# Patient Record
Sex: Male | Born: 1950 | Race: White | Hispanic: No | Marital: Married | Smoking: Never smoker
Health system: Southern US, Community
[De-identification: ages and names within clinical notes are randomized; demographics above are authoritative.]

## PROBLEM LIST (undated history)

## (undated) DIAGNOSIS — I10 Essential (primary) hypertension: Secondary | ICD-10-CM

## (undated) DIAGNOSIS — J8410 Pulmonary fibrosis, unspecified: Secondary | ICD-10-CM

## (undated) DIAGNOSIS — E785 Hyperlipidemia, unspecified: Secondary | ICD-10-CM

---

## 2005-02-02 ENCOUNTER — Ambulatory Visit: Payer: Self-pay | Admitting: Family Medicine

## 2007-04-23 ENCOUNTER — Ambulatory Visit: Payer: Self-pay

## 2007-11-22 ENCOUNTER — Ambulatory Visit: Payer: Self-pay | Admitting: Gastroenterology

## 2013-05-23 ENCOUNTER — Ambulatory Visit: Payer: Self-pay | Admitting: Specialist

## 2013-08-21 ENCOUNTER — Ambulatory Visit: Payer: Self-pay | Admitting: Specialist

## 2013-08-21 LAB — CREATININE, SERUM
CREATININE: 1.06 mg/dL (ref 0.60–1.30)
EGFR (African American): >60
EGFR (Non-African Amer.): >60

## 2013-08-22 DIAGNOSIS — J449 Chronic obstructive pulmonary disease, unspecified: Secondary | ICD-10-CM | POA: Insufficient documentation

## 2013-08-22 DIAGNOSIS — E669 Obesity, unspecified: Secondary | ICD-10-CM | POA: Insufficient documentation

## 2013-08-22 DIAGNOSIS — G4734 Idiopathic sleep related nonobstructive alveolar hypoventilation: Secondary | ICD-10-CM | POA: Insufficient documentation

## 2013-09-19 DIAGNOSIS — E785 Hyperlipidemia, unspecified: Secondary | ICD-10-CM | POA: Insufficient documentation

## 2014-01-04 ENCOUNTER — Ambulatory Visit: Payer: Self-pay

## 2014-01-04 LAB — URINALYSIS, COMPLETE: Squamous Epithelial: NONE SEEN

## 2014-01-11 ENCOUNTER — Ambulatory Visit: Payer: Self-pay

## 2014-02-06 DIAGNOSIS — Z87898 Personal history of other specified conditions: Secondary | ICD-10-CM | POA: Insufficient documentation

## 2014-12-15 DIAGNOSIS — I10 Essential (primary) hypertension: Secondary | ICD-10-CM | POA: Insufficient documentation

## 2015-06-18 DIAGNOSIS — I6522 Occlusion and stenosis of left carotid artery: Secondary | ICD-10-CM | POA: Insufficient documentation

## 2017-07-04 ENCOUNTER — Encounter: Payer: Self-pay | Admitting: Emergency Medicine

## 2017-07-04 ENCOUNTER — Other Ambulatory Visit: Payer: Self-pay

## 2017-07-04 ENCOUNTER — Emergency Department: Payer: 59

## 2017-07-04 ENCOUNTER — Emergency Department
Admission: EM | Admit: 2017-07-04 | Discharge: 2017-07-04 | Disposition: A | Discharge status: Home / Self Care | Payer: 59 | Attending: Emergency Medicine | Admitting: Emergency Medicine

## 2017-07-04 DIAGNOSIS — R509 Fever, unspecified: Secondary | ICD-10-CM | POA: Diagnosis present

## 2017-07-04 DIAGNOSIS — J181 Lobar pneumonia, unspecified organism: Secondary | ICD-10-CM | POA: Insufficient documentation

## 2017-07-04 DIAGNOSIS — I10 Essential (primary) hypertension: Secondary | ICD-10-CM | POA: Insufficient documentation

## 2017-07-04 DIAGNOSIS — J189 Pneumonia, unspecified organism: Secondary | ICD-10-CM

## 2017-07-04 HISTORY — DX: Hyperlipidemia, unspecified: E78.5

## 2017-07-04 HISTORY — DX: Pulmonary fibrosis, unspecified: J84.10

## 2017-07-04 HISTORY — DX: Essential (primary) hypertension: I10

## 2017-07-04 LAB — CBC
HEMATOCRIT: 41.1 % (ref 40.0–52.0)
Hemoglobin: 14.4 g/dL (ref 13.0–18.0)
MCH: 31.7 pg (ref 26.0–34.0)
MCHC: 35 g/dL (ref 32.0–36.0)
MCV: 90.6 fL (ref 80.0–100.0)
PLATELETS: 267 10*3/uL (ref 150–440)
RBC: 4.54 MIL/uL (ref 4.40–5.90)
RDW: 12.7 % (ref 11.5–14.5)
WBC: 9.2 10*3/uL (ref 3.8–10.6)

## 2017-07-04 LAB — BASIC METABOLIC PANEL
Anion gap: 10 (ref 5–15)
BUN: 11 mg/dL (ref 6–20)
CALCIUM: 8.9 mg/dL (ref 8.9–10.3)
CO2: 23 mmol/L (ref 22–32)
CREATININE: 0.92 mg/dL (ref 0.61–1.24)
Chloride: 101 mmol/L (ref 101–111)
GFR calc Af Amer: >60 mL/min (ref >60)
GLUCOSE: 118 mg/dL — AB (ref 65–99)
Potassium: 3.8 mmol/L (ref 3.5–5.1)
SODIUM: 134 mmol/L — AB (ref 135–145)

## 2017-07-04 LAB — TROPONIN I

## 2017-07-04 MED ORDER — SODIUM CHLORIDE 0.9 % IV SOLN
1.0000 g | Freq: Once | INTRAVENOUS | Status: AC
  Administered 2017-07-04: 1 g via INTRAVENOUS
  Filled 2017-07-04: qty 10

## 2017-07-04 MED ORDER — ACETAMINOPHEN 325 MG PO TABS
650.0000 mg | ORAL_TABLET | Freq: Once | ORAL | Status: AC | PRN
  Administered 2017-07-04: 650 mg via ORAL

## 2017-07-04 MED ORDER — AMOXICILLIN-POT CLAVULANATE ER 1000-62.5 MG PO TB12: 2.0000 | ORAL_TABLET | Freq: Two times a day (BID) | ORAL | 0 refills | Status: DC

## 2017-07-04 MED ORDER — ACETAMINOPHEN 325 MG PO TABS
ORAL_TABLET | ORAL | Status: AC
  Administered 2017-07-04: 650 mg via ORAL
  Filled 2017-07-04: qty 2

## 2017-07-04 MED ORDER — AZITHROMYCIN 250 MG PO TABS: ORAL_TABLET | ORAL | 0 refills | Status: DC

## 2017-07-04 MED ORDER — SODIUM CHLORIDE 0.9 % IV SOLN
500.0000 mg | Freq: Once | INTRAVENOUS | Status: AC
  Administered 2017-07-04: 500 mg via INTRAVENOUS
  Filled 2017-07-04: qty 500

## 2017-07-04 NOTE — ED Provider Notes (Signed)
[facility] Emergency Department Provider Note ____________________________________________   I have reviewed the triage vital signs and the triage nursing note.  HISTORY  Chief Complaint Shortness of Breath and Fever   Historian Patient and wife  HPI Brian Orr is a 67 y.o. male with a history of pulmonary fibrosis, wears oxygen as needed at night, also hypertension hyperlipidemia, states that he was sick last weekend with the flu with fevers, did not take Tamiflu, had low-grade temperatures throughout the week and did not have a fever yesterday and then developed fever again up to 102 and cough which is productive of green sputum yesterday.  Some mild chest pain with cough on the right side.  The sputum also had not only green but some small flecks of blood in it.  Mild shortness of breath like he has had, without wheezing.  Symptoms are moderate.   Past Medical History:  Diagnosis Date  . Hyperlipidemia   . Hypertension   . Pulmonary fibrosis (HCC)     There are no active problems to display for this patient.   History reviewed. No pertinent surgical history.  Prior to Admission medications   Medication Sig Start Date End Date Taking? Authorizing Provider  amoxicillin-clavulanate (AUGMENTIN XR) 1000-62.5 MG 12 hr tablet Take 2 tablets by mouth 2 (two) times daily. 07/04/17   Governor Rooks, MD  azithromycin (ZITHROMAX) 250 MG tablet 1 tablet daily for 4 more days 07/04/17   Governor Rooks, MD    Allergies  Allergen Reactions  . Wasp Venom Anaphylaxis  . Other     Other reaction(s): Unknown POISON IVY    No family history on file.  Social History Social History   Tobacco Use  . Smoking status: Never Smoker  Substance Use Topics  . Alcohol use: Not Currently  . Drug use: Not on file    Review of Systems  Constitutional: Positive for fever. Eyes: Negative for visual changes. ENT: Negative for sore throat. Cardiovascular: Positive  right sided cough induced chest pain as per HPI.   Respiratory: Mild shortness of breath. Gastrointestinal: Negative for abdominal pain, vomiting and diarrhea. Genitourinary: Negative for dysuria. Musculoskeletal: Negative for back pain. Skin: Negative for rash. Neurological: Negative for headache.  ____________________________________________   PHYSICAL EXAM:  VITAL SIGNS: ED Triage Vitals  Enc Vitals Group     BP 07/04/17 0149 130/65     Pulse Rate 07/04/17 0149 (!) 106     Resp 07/04/17 0149 20     Temp 07/04/17 0149 100.1 F (37.8 C)     Temp Source 07/04/17 0149 Oral     SpO2 07/04/17 0149 94 %     Weight 07/04/17 0150 220 lb (99.8 kg)     Height 07/04/17 0150 5\' 8"  (1.727 m)     Head Circumference --      Peak Flow --      Pain Score 07/04/17 0149 9     Pain Loc --      Pain Edu? --      Excl. in GC? --      Constitutional: Alert and oriented. Well appearing and in no distress. HEENT   Head: Normocephalic and atraumatic.      Eyes: Conjunctivae are normal. Pupils equal and round.       Ears:         Nose: No congestion/rhinnorhea.   Mouth/Throat: Mucous membranes are moist.   Neck: No stridor. Cardiovascular/Chest: Normal rate, regular rhythm.  No murmurs, rubs, or gallops.  Respiratory: Normal respiratory effort without tachypnea nor retractions.  Decreased breath sounds right posteriorly. Gastrointestinal: Soft. No distention, no guarding, no rebound. Nontender.    Genitourinary/rectal:Deferred Musculoskeletal: Nontender with normal range of motion in all extremities. No joint effusions.  No lower extremity tenderness.  No edema. Neurologic:  Normal speech and language. No gross or focal neurologic deficits are appreciated. Skin:  Skin is warm, dry and intact. No rash noted. Psychiatric: Mood and affect are normal. Speech and behavior are normal. Patient exhibits appropriate insight and  judgment.   ____________________________________________  LABS (pertinent positives/negatives) I, Governor Rooks, MD the attending physician have reviewed the labs noted below.  Labs Reviewed  BASIC METABOLIC PANEL - Abnormal; Notable for the following components:      Result Value   Sodium 134 (*)    Glucose, Bld 118 (*)    All other components within normal limits  CULTURE, BLOOD (ROUTINE X 2)  CULTURE, BLOOD (ROUTINE X 2)  CBC  TROPONIN I    ____________________________________________    EKG I, Governor Rooks, MD, the attending physician have personally viewed and interpreted all ECGs.  107 bpm.  Sinus tachycardia.  Narrow transfer normal axis.  Nonspecific ST and T wave ____________________________________________  RADIOLOGY   Chest x-ray 2 view radiologist interpretation:Superimposed on fine subpleural areas of fibrosis are faint airspace opacities in the right middle and both lower lobes suspicious for superimposed pneumonia. Cardiomediastinal contours are within normal limits with aortic atherosclerosis. No effusion or pneumothorax.  IMPRESSION: Subtle airspace opacities consistent with pneumonia in the right middle and both lower lobes superimposed on interstitial pulmonary fibrosis. __________________________________________  PROCEDURES  Procedure(s) performed: None  Procedures  Critical Care performed: None   ____________________________________________  ED COURSE / ASSESSMENT AND PLAN  Pertinent labs & imaging results that were available during my care of the patient were reviewed by me and considered in my medical decision making (see chart for details).   Patient was sick last weekend with the flu, now is reporting new fever and productive green sputum with a little bit of blood and on vitals here he did have a fever with a slight tachycardia which is now resolved in terms of the tachycardia.  His x-ray is consistent with developed right sided  pneumonia in the middle and lower lobes.  He is overall pretty well appearing and would like to go home.  I do not think that he is showing signs of sepsis.  He has no elevated white blood cell count, no hypotension.  His O2 sat is around 93% at rest.  He does have oxygen available at home.  I did order blood cultures prior to antibiotics and am giving him his first dose by IV Rocephin and azithromycin and will discharge with Augmentin 2 g p.o. twice daily for 10 days and completion of the azithromycin course, 250 mg once daily for 4 more days.  Not suspicious of empyema or PE at this point.  Patient did well with walking, down to 91% but okay.  Okay for outpatient management.    CONSULTATIONS:   None   Patient / Family / Caregiver informed of clinical course, medical decision-making process, and agree with plan.   I discussed return precautions, follow-up instructions, and discharge instructions with patient and/or family.  Discharge Instructions : You are being treated for pneumonia in the right middle and lower lung.  Return to the emergency department immediately for any worsening condition including trouble breathing, shortness of breath, fever after 24 hours, dizziness  or passing out, any confusion or altered mental status.    ___________________________________________   FINAL CLINICAL IMPRESSION(S) / ED DIAGNOSES   Final diagnoses:  Community acquired pneumonia of right middle lobe of lung (HCC)  Community acquired pneumonia of right lower lobe of lung (HCC)      ___________________________________________        Note: This dictation was prepared with Office managerDragon dictation. Any transcriptional errors that result from this process are unintentional    Governor RooksLord, Eden Toohey, MD 07/04/17 1021

## 2017-07-04 NOTE — Discharge Instructions (Signed)
You are being treated for pneumonia in the right middle and lower lung.  Return to the emergency department immediately for any worsening condition including trouble breathing, shortness of breath, fever after 24 hours, dizziness or passing out, any confusion or altered mental status.

## 2017-07-04 NOTE — ED Notes (Signed)
Patient updated on wait time. Patient verbalizes understanding. Patient in no acute distress at this time.

## 2017-07-04 NOTE — ED Notes (Signed)
Dr. Shaune PollackLord notified of patients oxygen saturation while ambulating, pt ok for discharge once antibiotics are completed.

## 2017-07-04 NOTE — ED Triage Notes (Signed)
Pt presents to ER from home history of pulmonary fibrosis pt's wife reports he had the flu last week and developed a fever tonight of 102. F reports continues with productive cough sputum  yellow in color .

## 2017-07-04 NOTE — ED Notes (Signed)
Pt's wife reports pt wears oxygen at night 2l/[place], administered oxygen to pt

## 2017-07-09 LAB — CULTURE, BLOOD (ROUTINE X 2)
Culture: NO GROWTH
Culture: NO GROWTH
SPECIAL REQUESTS: ADEQUATE

## 2019-02-19 DIAGNOSIS — Z8616 Personal history of COVID-19: Secondary | ICD-10-CM | POA: Insufficient documentation

## 2020-02-22 DIAGNOSIS — Z6710 Type A blood, Rh positive: Secondary | ICD-10-CM | POA: Insufficient documentation

## 2020-02-25 ENCOUNTER — Emergency Department: Payer: Medicare HMO

## 2020-02-25 ENCOUNTER — Encounter: Payer: Self-pay | Admitting: Emergency Medicine

## 2020-02-25 ENCOUNTER — Inpatient Hospital Stay
Admission: EM | Admit: 2020-02-25 | Discharge: 2020-02-27 | DRG: 087 | Disposition: A | Discharge status: Home / Self Care | Payer: Medicare HMO | Attending: Internal Medicine | Admitting: Internal Medicine

## 2020-02-25 ENCOUNTER — Observation Stay: Payer: Medicare HMO

## 2020-02-25 ENCOUNTER — Other Ambulatory Visit: Payer: Self-pay

## 2020-02-25 DIAGNOSIS — S066X0A Traumatic subarachnoid hemorrhage without loss of consciousness, initial encounter: Secondary | ICD-10-CM | POA: Diagnosis present

## 2020-02-25 DIAGNOSIS — Z8679 Personal history of other diseases of the circulatory system: Secondary | ICD-10-CM | POA: Insufficient documentation

## 2020-02-25 DIAGNOSIS — Z6831 Body mass index (BMI) 31.0-31.9, adult: Secondary | ICD-10-CM

## 2020-02-25 DIAGNOSIS — S065X0A Traumatic subdural hemorrhage without loss of consciousness, initial encounter: Principal | ICD-10-CM | POA: Diagnosis present

## 2020-02-25 DIAGNOSIS — R9431 Abnormal electrocardiogram [ECG] [EKG]: Secondary | ICD-10-CM

## 2020-02-25 DIAGNOSIS — Z91048 Other nonmedicinal substance allergy status: Secondary | ICD-10-CM

## 2020-02-25 DIAGNOSIS — K1120 Sialoadenitis, unspecified: Secondary | ICD-10-CM | POA: Diagnosis present

## 2020-02-25 DIAGNOSIS — Y92009 Unspecified place in unspecified non-institutional (private) residence as the place of occurrence of the external cause: Secondary | ICD-10-CM | POA: Diagnosis not present

## 2020-02-25 DIAGNOSIS — Z91030 Bee allergy status: Secondary | ICD-10-CM

## 2020-02-25 DIAGNOSIS — E669 Obesity, unspecified: Secondary | ICD-10-CM | POA: Diagnosis present

## 2020-02-25 DIAGNOSIS — I609 Nontraumatic subarachnoid hemorrhage, unspecified: Secondary | ICD-10-CM

## 2020-02-25 DIAGNOSIS — E785 Hyperlipidemia, unspecified: Secondary | ICD-10-CM | POA: Diagnosis present

## 2020-02-25 DIAGNOSIS — S065XAA Traumatic subdural hemorrhage with loss of consciousness status unknown, initial encounter: Secondary | ICD-10-CM | POA: Diagnosis present

## 2020-02-25 DIAGNOSIS — I951 Orthostatic hypotension: Secondary | ICD-10-CM | POA: Diagnosis present

## 2020-02-25 DIAGNOSIS — Y92019 Unspecified place in single-family (private) house as the place of occurrence of the external cause: Secondary | ICD-10-CM

## 2020-02-25 DIAGNOSIS — I6523 Occlusion and stenosis of bilateral carotid arteries: Secondary | ICD-10-CM | POA: Diagnosis present

## 2020-02-25 DIAGNOSIS — S065X9A Traumatic subdural hemorrhage with loss of consciousness of unspecified duration, initial encounter: Secondary | ICD-10-CM | POA: Diagnosis present

## 2020-02-25 DIAGNOSIS — R55 Syncope and collapse: Principal | ICD-10-CM

## 2020-02-25 DIAGNOSIS — I10 Essential (primary) hypertension: Secondary | ICD-10-CM | POA: Diagnosis present

## 2020-02-25 DIAGNOSIS — W19XXXA Unspecified fall, initial encounter: Secondary | ICD-10-CM | POA: Diagnosis not present

## 2020-02-25 DIAGNOSIS — L0390 Cellulitis, unspecified: Secondary | ICD-10-CM | POA: Diagnosis present

## 2020-02-25 DIAGNOSIS — Z20822 Contact with and (suspected) exposure to covid-19: Secondary | ICD-10-CM | POA: Diagnosis present

## 2020-02-25 DIAGNOSIS — W1839XA Other fall on same level, initial encounter: Secondary | ICD-10-CM | POA: Diagnosis present

## 2020-02-25 DIAGNOSIS — J8410 Pulmonary fibrosis, unspecified: Secondary | ICD-10-CM | POA: Diagnosis present

## 2020-02-25 DIAGNOSIS — I6200 Nontraumatic subdural hemorrhage, unspecified: Secondary | ICD-10-CM

## 2020-02-25 LAB — CBC
HCT: 39.9 % (ref 39.0–52.0)
Hemoglobin: 14 g/dL (ref 13.0–17.0)
MCH: 32.3 pg (ref 26.0–34.0)
MCHC: 35.1 g/dL (ref 30.0–36.0)
MCV: 92.1 fL (ref 80.0–100.0)
Platelets: 296 10*3/uL (ref 150–400)
RBC: 4.33 MIL/uL (ref 4.22–5.81)
RDW: 11.6 % (ref 11.5–15.5)
WBC: 9.5 10*3/uL (ref 4.0–10.5)
nRBC: 0 % (ref 0.0–0.2)

## 2020-02-25 LAB — BASIC METABOLIC PANEL
Anion gap: 13 (ref 5–15)
BUN: 14 mg/dL (ref 8–23)
CO2: 27 mmol/L (ref 22–32)
Calcium: 9.3 mg/dL (ref 8.9–10.3)
Chloride: 99 mmol/L (ref 98–111)
Creatinine, Ser: 1.07 mg/dL (ref 0.61–1.24)
GFR, Estimated: >60 mL/min (ref >60)
Glucose, Bld: 121 mg/dL — ABNORMAL HIGH (ref 70–99)
Potassium: 3.4 mmol/L — ABNORMAL LOW (ref 3.5–5.1)
Sodium: 139 mmol/L (ref 135–145)

## 2020-02-25 LAB — RESPIRATORY PANEL BY RT PCR (FLU A&B, COVID)
Influenza A by PCR: NEGATIVE
Influenza B by PCR: NEGATIVE
SARS Coronavirus 2 by RT PCR: NEGATIVE

## 2020-02-25 LAB — TROPONIN I (HIGH SENSITIVITY)
Troponin I (High Sensitivity): 6 ng/L (ref <18)
Troponin I (High Sensitivity): 6 ng/L (ref <18)

## 2020-02-25 MED ORDER — SODIUM CHLORIDE 0.9 % IV SOLN
1.5000 g | Freq: Three times a day (TID) | INTRAVENOUS | Status: DC
  Administered 2020-02-26: 08:00:00 1.5 g via INTRAVENOUS
  Filled 2020-02-25: qty 1.5
  Filled 2020-02-25 (×2): qty 4

## 2020-02-25 MED ORDER — ALBUTEROL SULFATE HFA 108 (90 BASE) MCG/ACT IN AERS
1.0000 | INHALATION_SPRAY | RESPIRATORY_TRACT | Status: DC | PRN
  Filled 2020-02-25: qty 6.7

## 2020-02-25 MED ORDER — IPRATROPIUM-ALBUTEROL 0.5-2.5 (3) MG/3ML IN SOLN: 3.0000 mL | Freq: Four times a day (QID) | RESPIRATORY_TRACT | Status: DC | PRN

## 2020-02-25 MED ORDER — SODIUM CHLORIDE 0.9 % IV SOLN
3.0000 g | Freq: Once | INTRAVENOUS | Status: AC
  Administered 2020-02-25: 3 g via INTRAVENOUS
  Filled 2020-02-25: qty 8

## 2020-02-25 MED ORDER — THIAMINE HCL 100 MG/ML IJ SOLN
Freq: Once | INTRAVENOUS | Status: AC
  Filled 2020-02-25: qty 1000

## 2020-02-25 MED ORDER — IOHEXOL 300 MG/ML  SOLN
75.0000 mL | Freq: Once | INTRAMUSCULAR | Status: AC | PRN
  Administered 2020-02-25: 75 mL via INTRAVENOUS

## 2020-02-25 MED ORDER — CALCIUM CARBONATE ANTACID 500 MG PO CHEW
2.0000 | CHEWABLE_TABLET | Freq: Once | ORAL | Status: AC
  Administered 2020-02-25: 400 mg via ORAL
  Filled 2020-02-25: qty 2

## 2020-02-25 NOTE — Assessment & Plan Note (Signed)
presentation is suspicious for syncope so we will do an echo and carotid and mri or brain.  Orthostatic vitals.  Cardiac enzymes. comprehensive labs and  tft.  Admit obs to med tele.

## 2020-02-25 NOTE — Discharge Instructions (Addendum)
Do not take any aspirin or blood thinners for 1 week from today.  Call Dr. Adriana Simas the neurosurgeon's office to schedule a follow-up appointment in 2 to 3 weeks.

## 2020-02-25 NOTE — Assessment & Plan Note (Signed)
Neuro checks q4 h

## 2020-02-25 NOTE — ED Triage Notes (Signed)
Pt presents to ED via POV, states got "really really dizzy and fell". Pt also c/o cramp to his jaw. Pt states hit his head, denies LOC. Pt A&O x4, ambulatory without difficulty. Pt c/o feeling a knot to L side of his face.

## 2020-02-25 NOTE — ED Notes (Signed)
Echo at bedside with patient and then to MRI after before transport to the floor. Patient and family aware of plan.

## 2020-02-25 NOTE — H&P (Signed)
History and Physical    Brian RongDavid Sedano ZOX:[id number] DOB: 03/14/1951 DOA: 02/25/2020  [place]    Patient coming from:  home   Chief Complaint:  Dizziness and fall   HPI: Brian Orr is a 69 y.o. male with no significant pmh seen in ed for fall and hurting his jaw and head and head ct shows Anterior falcine subdural hematoma measuring 3 mm in thickness. 2. Small areas of focal subarachnoid hemorrhage along the medial aspect of the left frontal lobe. 3. Small right occipital scalp hematoma.  No underlying fracture, also Mild thickening of the platysma and subcutaneous fat stranding of the lower left face, near the tail of the parotid gland, likely indicating cellulitis. Per edmd PT had ekg changes with twi in lead III and ? If this is cardiac related.  Pt has not bp history.  But we will admit of r observation and cycle  cardiac enzymes and echo. Pt state he ate one bite and had left sided facil apian nad got up to get water and he got dizzy and fell.  ED Course:  Vitals:   02/25/20 2000 02/25/20 2015 02/25/20 2030 02/25/20 2045  BP: 132/61 (!) 160/84 (!) 150/72 (!) 143/68  Pulse: 84 99 86 81  Resp: 17 (!) 22 (!) 21 18  Temp:      TempSrc:      SpO2: 95% 95% 92% 98%  Weight:      Height:      In ed pt is alert and oriented and had imaging studies and labs which are stable.     Review of Systems:  Review of Systems  Neurological: Positive for dizziness, loss of consciousness and weakness.  All other systems reviewed and are negative.    Past Medical History:  Diagnosis Date  . Hyperlipidemia   . Hypertension   . Pulmonary fibrosis (HCC)     History reviewed. No pertinent surgical history.   reports that he has never smoked. He does not have any smokeless tobacco history on file. He reports previous alcohol use. No history on file for drug use.  Allergies  Allergen Reactions  . Wasp Venom Anaphylaxis  . Other     Other reaction(s):  Unknown POISON IVY    History reviewed. No pertinent family history.  Prior to Admission medications   Medication Sig Start Date End Date Taking? Authorizing Provider  amoxicillin-clavulanate (AUGMENTIN XR) 1000-62.5 MG 12 hr tablet Take 2 tablets by mouth 2 (two) times daily. 07/04/17   Governor RooksLord, Rebecca, MD  azithromycin [facility](ZITHROMAX) 250 MG tablet 1 tablet daily for 4 more days 07/04/17   Governor RooksLord, Rebecca, MD    Physical Exam: Vitals:   02/25/20 2000 02/25/20 2015 02/25/20 2030 02/25/20 2045  BP: 132/61 (!) 160/84 (!) 150/72 (!) 143/68  Pulse: 84 99 86 81  Resp: 17 (!) 22 (!) 21 18  Temp:      TempSrc:      SpO2: 95% 95% 92% 98%  Weight:      Height:        Physical Exam Vitals and nursing note reviewed.  Constitutional:      Appearance: He is normal weight.  HENT:     Head: Normocephalic and atraumatic.     Right Ear: External ear normal.     Left Ear: External ear normal.     Mouth/Throat:     Mouth: Mucous membranes are moist.  Eyes:     Extraocular Movements: Extraocular movements intact.  Pupils: Pupils are equal, round, and reactive to light.  Cardiovascular:     Rate and Rhythm: Normal rate and regular rhythm.     Pulses: Normal pulses.     Heart sounds: Normal heart sounds.  Pulmonary:     Effort: Pulmonary effort is normal. No respiratory distress.     Breath sounds: Examination of the right-upper field reveals rhonchi. Examination of the left-upper field reveals rhonchi. Examination of the right-middle field reveals rhonchi. Examination of the left-middle field reveals rhonchi. Examination of the right-lower field reveals rhonchi. Examination of the left-lower field reveals rhonchi. Rhonchi present.  Abdominal:     Palpations: Abdomen is soft.  Musculoskeletal:        General: No swelling. Normal range of motion.     Cervical back: Normal range of motion.  Skin:    General: Skin is warm.  Neurological:     General: No focal deficit present.     Mental  Status: He is alert and oriented to person, place, and time.  Psychiatric:        Mood and Affect: Mood normal.        Behavior: Behavior normal.    Labs on Admission: I have personally reviewed following labs and imaging studies  CBC: Recent Labs  Lab 02/25/20 1752  WBC 9.5  HGB 14.0  HCT 39.9  MCV 92.1  PLT 296   Basic Metabolic Panel: Recent Labs  Lab 02/25/20 1752  NA 139  K 3.4*  CL 99  CO2 27  GLUCOSE 121*  BUN 14  CREATININE 1.07  CALCIUM 9.3   GFR: Estimated Creatinine Clearance: 75.9 mL/min (by C-G formula based on SCr of 1.07 mg/dL). Liver Function Tests: No results for input(s): AST, ALT, ALKPHOS, BILITOT, PROT, ALBUMIN in the last 168 hours. No results for input(s): LIPASE, AMYLASE in the last 168 hours. No results for input(s): AMMONIA in the last 168 hours. Coagulation Profile: No results for input(s): INR, PROTIME in the last 168 hours. Cardiac Enzymes: No results for input(s): CKTOTAL, CKMB, CKMBINDEX, TROPONINI in the last 168 hours. BNP (last 3 results) No results for input(s): PROBNP in the last 8760 hours. HbA1C: No results for input(s): HGBA1C in the last 72 hours. CBG: No results for input(s): GLUCAP in the last 168 hours. Lipid Profile: No results for input(s): CHOL, HDL, LDLCALC, TRIG, CHOLHDL, LDLDIRECT in the last 72 hours. Thyroid Function Tests: No results for input(s): TSH, T4TOTAL, FREET4, T3FREE, THYROIDAB in the last 72 hours. Anemia Panel: No results for input(s): VITAMINB12, FOLATE, FERRITIN, TIBC, IRON, RETICCTPCT in the last 72 hours. Urine analysis:    Component Value Date/Time   COLORURINE ORANGE 01/04/2014 1405   APPEARANCEUR HAZY 01/04/2014 1405   LABSPEC SEE COMMENT 01/04/2014 1405   PHURINE SEE COMMENT 01/04/2014 1405   GLUCOSEU SEE COMMENT 01/04/2014 1405   HGBUR SEE COMMENT 01/04/2014 1405   BILIRUBINUR SEE COMMENT 01/04/2014 1405   KETONESUR SEE COMMENT 01/04/2014 1405   PROTEINUR SEE COMMENT 01/04/2014 1405    NITRITE SEE COMMENT 01/04/2014 1405   LEUKOCYTESUR SEE COMMENT 01/04/2014 1405   No intake or output data in the 24 hours ending 02/25/20 2248 Lab Results  Component Value Date   CREATININE 1.07 02/25/2020   CREATININE 0.92 07/04/2017   CREATININE 1.06 08/21/2013    COVID-19 Labs  No results for input(s): DDIMER, FERRITIN, LDH, CRP in the last 72 hours.  Lab Results  Component Value Date   SARSCOV2NAA NEGATIVE 02/25/2020    Radiological Exams on  Admission: DG Chest 2 View  Result Date: 02/25/2020 CLINICAL DATA:  69 year old male with fall. EXAM: CHEST - 2 VIEW COMPARISON:  Chest radiograph dated 07/04/2017 FINDINGS: Emphysema with chronic interstitial coarsening. There is a 2 cm right upper lobe nodule, new since the prior radiograph. Further evaluation with chest CT is recommended. No consolidative changes. There is no pleural effusion pneumothorax. The cardiac silhouette is within limits. No acute osseous pathology. Degenerative changes of the spine. IMPRESSION: 1. No acute cardiopulmonary process.  Emphysema. 2. A 2 cm right upper lobe nodule. Further evaluation with chest CT is recommended. Electronically Signed   By: Elgie Collard M.D.   On: 02/25/2020 19:45   CT Head Wo Contrast Result Date: 02/25/2020 CLINICAL DATA:  Dizzy, fell EXAM: CT HEAD WITHOUT CONTRAST TECHNIQUE: Contiguous axial images were obtained from the base of the skull through the vertex without intravenous contrast. COMPARISON:  None. FINDINGS: Brain: There is a small subdural hematoma along the anterior falx, measuring approximately 3 mm in thickness. Small hyperdense areas are seen within the medial aspect the left frontal lobe, consistent with small foci of subarachnoid hemorrhage. No mass effect. No acute infarct. Lateral ventricles are unremarkable. Vascular: No hyperdense vessel or unexpected calcification. Skull: Small right occipital scalp hematoma. No underlying fracture. The remainder of the  calvarium is unremarkable. Sinuses/Orbits: No acute finding. Other: None.  IMPRESSION:  1. Anterior falcine subdural hematoma measuring 3 mm in thickness.  2. Small areas of focal subarachnoid hemorrhage along the medial aspect of the left frontal lobe.  3. Small right occipital scalp hematoma.  No underlying fracture. Critical Value/emergent results were called by telephone at the time of interpretation on 02/25/2020 at 7:06 pm to provider [facility] , who verbally acknowledged these results. Electronically Signed   By: Sharlet Salina M.D.   On: 02/25/2020 19:06   CT Cervical Spine Wo Contrast Result Date: 02/25/2020 CLINICAL DATA:  69 year old male with head trauma. EXAM: CT CERVICAL SPINE WITHOUT CONTRAST TECHNIQUE: Multidetector CT imaging of the cervical spine was performed without intravenous contrast. Multiplanar CT image reconstructions were also generated. COMPARISON:  None. FINDINGS: Alignment: No acute subluxation. Skull base and vertebrae: No acute fracture. Osteopenia. Soft tissues and spinal canal: No prevertebral fluid or swelling. No visible canal hematoma. Disc levels: Multilevel degenerative changes with endplate irregularity and disc space narrowing. Upper chest: Emphysema. Other: Bilateral carotid bulb calcified plaques.  IMPRESSION:  1. No acute/traumatic cervical spine pathology.  2. Multilevel degenerative changes.  Electronically Signed   By: Elgie Collard M.D.   On: 02/25/2020 18:57   CT Maxillofacial W Contrast Result Date: 02/25/2020 CLINICAL DATA:  Left facial pain EXAM: CT MAXILLOFACIAL WITH CONTRAST TECHNIQUE: Multidetector CT imaging of the maxillofacial structures was performed with intravenous contrast. Multiplanar CT image reconstructions were also generated. CONTRAST:  55mL OMNIPAQUE IOHEXOL 300 MG/ML  SOLN COMPARISON:  None. FINDINGS: Osseous: No fracture or mandibular dislocation. No destructive process. Orbits: Negative. No traumatic or inflammatory finding.  Sinuses: Clear. Soft tissues: There is mild thickening of the platysma and subcutaneous fat stranding of the lower left face, near the tail of the parotid gland. No sialolithiasis. No obvious source of infection. Limited intracranial: Normal  IMPRESSION: Mild thickening of the platysma and subcutaneous fat stranding of the lower left face, near the tail of the parotid gland, likely indicating cellulitis. No sialolithiasis or obvious source of infection. Electronically Signed   By: Deatra Robinson M.D.   On: 02/25/2020 20:56    EKG: Independently reviewed.  Sinus rhythm 95, TWI in lead III.    Assessment/Plan Subdural hematoma (HCC) Assessment & Plan Neuro checks q4 h   Cellulitis Assessment & Plan ? Parotitis we will continue iv abx.,    Fall at home, initial encounter/ Syncope Assessment & Plan presentation is suspicious for syncope so we will do an echo and carotid and mri or brain.  Orthostatic vitals.  Cardiac enzymes. comprehensive labs and  tft.  Admit obs to med tele.   DVT prophylaxis:  SCD's.   Code Status:  Full code.  Family Communication:  None at bedside.   Disposition Plan:  Home.   Consults called:  None.  Admission status: Status is: Observation  The patient remains OBS appropriate and will d/c before 2 midnights.  Dispo: The patient is from: Home              Anticipated d/c is to: Home              Anticipated d/c date is: 2 days              Patient currently is not medically stable to d/c.   Gertha Calkin MD [facility] [phone] How to contact the [facility] Attending or Consulting provider 7A - 7P or covering provider during after hours 7P -7A, for this patient?    1. Check the care team in [facility] and look for a) attending/consulting [facility] provider listed and b) the [facility] team listed 2. Log into [email or web address] and use [place]'s universal password to access. If you do not have the password, please contact the hospital operator. 3. Locate  the [facility] provider you are looking for under [facility] and page to a number that you can be directly reached. 4. If you still have difficulty reaching the provider, please page the [facility] (Director on Call) for the Hospitalists listed on amion for assistance. [email or web address] Password [facility] 02/25/2020, 10:48 PM

## 2020-02-25 NOTE — ED Notes (Signed)
MRI reports to this RN they are ready for patient.

## 2020-02-25 NOTE — ED Notes (Signed)
Attempted to give reportx1 

## 2020-02-25 NOTE — Assessment & Plan Note (Signed)
?   Parotitis we will continue iv abx.,

## 2020-02-25 NOTE — ED Provider Notes (Signed)
[facility] Emergency Department Provider Note   ____________________________________________   First MD Initiated Contact with Patient 02/25/20 1922     (approximate)  I have reviewed the triage vital signs and the nursing notes.   HISTORY  Chief Complaint Dizziness and Fall    HPI Brian Orr is a 69 y.o. male who noticed sudden onset of painful swelling at the angle of the left jaw.  He went to drink some water and then got very lightheaded fell down and hit his head.  He was then very sweaty.  He did not seem to pass out.  His wife was with him the whole time.  He feels much better now.  He still has a swollen painful area at the angle of the left jaw.         Past Medical History:  Diagnosis Date  . Hyperlipidemia   . Hypertension   . Pulmonary fibrosis [facility]HCC)     Patient Active Problem List   Diagnosis Date Noted  . Fall at home, initial encounter 02/25/2020    History reviewed. No pertinent surgical history.  Prior to Admission medications   Medication Sig Start Date End Date Taking? Authorizing Provider  amoxicillin-clavulanate (AUGMENTIN XR) 1000-62.5 MG 12 hr tablet Take 2 tablets by mouth 2 (two) times daily. 07/04/17   Governor RooksLord, Rebecca, MD  azithromycin (ZITHROMAX) 250 MG tablet 1 tablet daily for 4 more days 07/04/17   Governor RooksLord, Rebecca, MD    Allergies Wasp venom and Other  History reviewed. No pertinent family history.  Social History Social History   Tobacco Use  . Smoking status: Never Smoker  Substance Use Topics  . Alcohol use: Not Currently  . Drug use: Not on file    Review of Systems  Constitutional: No fever/chills Eyes: No visual changes. ENT: No sore throat. Cardiovascular: Denies chest pain. Respiratory: Denies shortness of breath. Gastrointestinal: No abdominal pain.  No nausea, no vomiting.  No diarrhea.  No constipation. Genitourinary: Negative for dysuria. Musculoskeletal: Negative for back  pain. Skin: Negative for rash. Neurological: Negative for headaches, focal weakness   ____________________________________________   PHYSICAL EXAM:  VITAL SIGNS: ED Triage Vitals  Enc Vitals Group     BP 02/25/20 1751 123/67     Pulse Rate 02/25/20 1751 98     Resp 02/25/20 1751 20     Temp 02/25/20 1751 98.2 F (36.8 C)     Temp Source 02/25/20 1751 Oral     SpO2 02/25/20 1751 96 %     Weight 02/25/20 1744 208 lb (94.3 kg)     Height 02/25/20 1744 5\' 9"  (1.753 m)     Head Circumference --      Peak Flow --      Pain Score 02/25/20 1743 4     Pain Loc --      Pain Edu? --      Excl. in GC? --     Constitutional: Alert and oriented. Well appearing and in no acute distress. Eyes: Conjunctivae are normal. PERRL. EOMI. Head: Atraumatic.  Patient does have a swelling at the left angle of the jaw as described.  It covers the angle of the jaw Nose: No congestion/rhinnorhea. Mouth/Throat: Mucous membranes are moist.  Oropharynx non-erythematous.  Teeth are not tender Neck: No stridor.  Cardiovascular: Normal rate, regular rhythm. Grossly normal heart sounds.  Good peripheral circulation. Respiratory: Normal respiratory effort.  No retractions. Lungs CTAB. Gastrointestinal: Soft and nontender. No distention. No abdominal bruits.  Musculoskeletal: No lower extremity tenderness nor edema.  Neurologic:  Normal speech and language. No gross focal neurologic deficits are appreciated.  Cranial nerves II through XII are intact although visual fields were not checked cerebellar finger-nose and rapid alternating movements and hands are normal motor strength is 5/5 throughout patient does not report any numbness Skin:  Skin is warm, dry and intact. No rash noted.  ____________________________________________   LABS (all labs ordered are listed, but only abnormal results are displayed)  Labs Reviewed  BASIC METABOLIC PANEL - Abnormal; Notable for the following components:      Result  Value   Potassium 3.4 (*)    Glucose, Bld 121 (*)    All other components within normal limits  RESPIRATORY PANEL BY RT PCR (FLU A&B, COVID)  CBC  TROPONIN I (HIGH SENSITIVITY)  TROPONIN I (HIGH SENSITIVITY)   ____________________________________________  EKG  EKG rhythm interpreted by me shows normal sinus rhythm at 95 flipped T's in 1 and now with left axis there is also flattening of T waves in V45 and 6. ___EKG #2 read interpreted by me shows normal sinus rhythm rate of 80 left axis a ST segment downsloping and somewhat inverted T waves in 1 and L have resolved completely.  There is still flattening of the T waves in V4 5 and 6 although it looks somewhat better than the original one.  _________________________________________  RADIOLOGY I, Arnaldo Natal, personally viewed and evaluated these images (plain radiographs) as part of my medical decision making, as well as reviewing the written report by the radiologist.  ED MD interpretation: CT of the head and neck read by radiology reviewed by me does not show any bony problems.  There is a small anterior falcine subdural hematoma and some small areas of subarachnoid hemorrhage.  Official radiology report(s): DG Chest 2 View  Result Date: 02/25/2020 CLINICAL DATA:  69 year old male with fall. EXAM: CHEST - 2 VIEW COMPARISON:  Chest radiograph dated 07/04/2017 FINDINGS: Emphysema with chronic interstitial coarsening. There is a 2 cm right upper lobe nodule, new since the prior radiograph. Further evaluation with chest CT is recommended. No consolidative changes. There is no pleural effusion pneumothorax. The cardiac silhouette is within limits. No acute osseous pathology. Degenerative changes of the spine. IMPRESSION: 1. No acute cardiopulmonary process.  Emphysema. 2. A 2 cm right upper lobe nodule. Further evaluation with chest CT is recommended. Electronically Signed   By: Elgie Collard M.D.   On: 02/25/2020 19:45   CT Head Wo  Contrast  Result Date: 02/25/2020 CLINICAL DATA:  Dizzy, fell EXAM: CT HEAD WITHOUT CONTRAST TECHNIQUE: Contiguous axial images were obtained from the base of the skull through the vertex without intravenous contrast. COMPARISON:  None. FINDINGS: Brain: There is a small subdural hematoma along the anterior falx, measuring approximately 3 mm in thickness. Small hyperdense areas are seen within the medial aspect the left frontal lobe, consistent with small foci of subarachnoid hemorrhage. No mass effect. No acute infarct. Lateral ventricles are unremarkable. Vascular: No hyperdense vessel or unexpected calcification. Skull: Small right occipital scalp hematoma. No underlying fracture. The remainder of the calvarium is unremarkable. Sinuses/Orbits: No acute finding. Other: None. IMPRESSION: 1. Anterior falcine subdural hematoma measuring 3 mm in thickness. 2. Small areas of focal subarachnoid hemorrhage along the medial aspect of the left frontal lobe. 3. Small right occipital scalp hematoma.  No underlying fracture. Critical Value/emergent results were called by telephone at the time of interpretation on 02/25/2020 at 7:06  pm to provider Dorothea Glassman , who verbally acknowledged these results. Electronically Signed   By: Sharlet Salina M.D.   On: 02/25/2020 19:06   CT Cervical Spine Wo Contrast  Result Date: 02/25/2020 CLINICAL DATA:  69 year old male with head trauma. EXAM: CT CERVICAL SPINE WITHOUT CONTRAST TECHNIQUE: Multidetector CT imaging of the cervical spine was performed without intravenous contrast. Multiplanar CT image reconstructions were also generated. COMPARISON:  None. FINDINGS: Alignment: No acute subluxation. Skull base and vertebrae: No acute fracture. Osteopenia. Soft tissues and spinal canal: No prevertebral fluid or swelling. No visible canal hematoma. Disc levels: Multilevel degenerative changes with endplate irregularity and disc space narrowing. Upper chest: Emphysema. Other: Bilateral  carotid bulb calcified plaques. IMPRESSION: 1. No acute/traumatic cervical spine pathology. 2. Multilevel degenerative changes. Electronically Signed   By: Elgie Collard M.D.   On: 02/25/2020 18:57   CT Maxillofacial W Contrast  Result Date: 02/25/2020 CLINICAL DATA:  Left facial pain EXAM: CT MAXILLOFACIAL WITH CONTRAST TECHNIQUE: Multidetector CT imaging of the maxillofacial structures was performed with intravenous contrast. Multiplanar CT image reconstructions were also generated. CONTRAST:  83mL OMNIPAQUE IOHEXOL 300 MG/ML  SOLN COMPARISON:  None. FINDINGS: Osseous: No fracture or mandibular dislocation. No destructive process. Orbits: Negative. No traumatic or inflammatory finding. Sinuses: Clear. Soft tissues: There is mild thickening of the platysma and subcutaneous fat stranding of the lower left face, near the tail of the parotid gland. No sialolithiasis. No obvious source of infection. Limited intracranial: Normal IMPRESSION: Mild thickening of the platysma and subcutaneous fat stranding of the lower left face, near the tail of the parotid gland, likely indicating cellulitis. No sialolithiasis or obvious source of infection. Electronically Signed   By: Deatra Robinson M.D.   On: 02/25/2020 20:56    ____________________________________________   PROCEDURES  Procedure(s) performed (including Critical Care):  Procedures   ____________________________________________   INITIAL IMPRESSION / ASSESSMENT AND PLAN / ED COURSE Discussed patient CT scan of the head with Dr. Adriana Simas neurosurgery who recommends repeat CT in 4 to 6 hours no aspirin for a week if there is no change she will follow-up in the office in about 2 to 3 weeks.   Patient has some dynamic EKG changes which lead me to suspect that his lightheaded episode associated with the sweating may have been cardiac in origin.  Patient has the subarachnoid and subdural hemorrhage so I cannot give him aspirin or heparin at this point.    ----------------------------------------- 9:42 PM on 02/25/2020 -----------------------------------------  CT of maxillofacial area with contrast shows some stranding around the tail of parotid gland.  Could be cellulitis although it does not look like cyst back clinically.  Could possibly parotitis.  Patient reports it came up suddenly which is    ----------------------------------------- 10:12 PM on 02/25/2020 -----------------------------------------  Patient's parotid area seems more swollen now than it was.  It is not erythematous.  I will begin him on some Unasyn in case there is an infection going on.  I will plan on admitting him for the near syncope and EKG changes and we can consult with ENT in the morning I expect.      ____________________________________________   FINAL CLINICAL IMPRESSION(S) / ED DIAGNOSES  Final diagnoses:  Near syncope  Abnormal EKG  Subdural hemorrhage (HCC)  Subarachnoid hemorrhage (HCC)  Parotitis     ED Discharge Orders    None      *Please note:  Brian Orr was evaluated in Emergency Department on 02/25/2020 for the symptoms described in  the history of present illness. He was evaluated in the context of the global COVID-19 pandemic, which necessitated consideration that the patient might be at risk for infection with the SARS-CoV-2 virus that causes COVID-19. Institutional protocols and algorithms that pertain to the evaluation of patients at risk for COVID-19 are in a state of rapid change based on information released by regulatory bodies including the CDC and federal and state organizations. These policies and algorithms were followed during the patient's care in the ED.  Some ED evaluations and interventions may be delayed as a result of limited staffing during and the pandemic.*   Note:  This document was prepared using Dragon voice recognition software and may include unintentional dictation errors.    Arnaldo Natal, MD 02/25/20  2213

## 2020-02-26 ENCOUNTER — Observation Stay: Payer: Medicare HMO

## 2020-02-26 ENCOUNTER — Inpatient Hospital Stay (HOSPITAL_COMMUNITY)
Admit: 2020-02-26 | Discharge: 2020-02-26 | Disposition: A | Discharge status: Home / Self Care | Payer: Medicare HMO | Attending: Internal Medicine | Admitting: Internal Medicine

## 2020-02-26 ENCOUNTER — Encounter: Payer: Self-pay | Admitting: Internal Medicine

## 2020-02-26 DIAGNOSIS — Z6831 Body mass index (BMI) 31.0-31.9, adult: Secondary | ICD-10-CM | POA: Diagnosis not present

## 2020-02-26 DIAGNOSIS — R55 Syncope and collapse: Secondary | ICD-10-CM

## 2020-02-26 DIAGNOSIS — W1839XA Other fall on same level, initial encounter: Secondary | ICD-10-CM | POA: Diagnosis present

## 2020-02-26 DIAGNOSIS — K1120 Sialoadenitis, unspecified: Secondary | ICD-10-CM | POA: Diagnosis present

## 2020-02-26 DIAGNOSIS — I609 Nontraumatic subarachnoid hemorrhage, unspecified: Secondary | ICD-10-CM | POA: Diagnosis present

## 2020-02-26 DIAGNOSIS — J8410 Pulmonary fibrosis, unspecified: Secondary | ICD-10-CM | POA: Diagnosis present

## 2020-02-26 DIAGNOSIS — Z91048 Other nonmedicinal substance allergy status: Secondary | ICD-10-CM | POA: Diagnosis not present

## 2020-02-26 DIAGNOSIS — S065X9A Traumatic subdural hemorrhage with loss of consciousness of unspecified duration, initial encounter: Secondary | ICD-10-CM

## 2020-02-26 DIAGNOSIS — I951 Orthostatic hypotension: Secondary | ICD-10-CM | POA: Diagnosis present

## 2020-02-26 DIAGNOSIS — Y92019 Unspecified place in single-family (private) house as the place of occurrence of the external cause: Secondary | ICD-10-CM | POA: Diagnosis not present

## 2020-02-26 DIAGNOSIS — W19XXXA Unspecified fall, initial encounter: Secondary | ICD-10-CM | POA: Diagnosis not present

## 2020-02-26 DIAGNOSIS — E785 Hyperlipidemia, unspecified: Secondary | ICD-10-CM | POA: Diagnosis present

## 2020-02-26 DIAGNOSIS — S066X0A Traumatic subarachnoid hemorrhage without loss of consciousness, initial encounter: Secondary | ICD-10-CM | POA: Diagnosis present

## 2020-02-26 DIAGNOSIS — Z20822 Contact with and (suspected) exposure to covid-19: Secondary | ICD-10-CM | POA: Diagnosis present

## 2020-02-26 DIAGNOSIS — I6523 Occlusion and stenosis of bilateral carotid arteries: Secondary | ICD-10-CM | POA: Diagnosis present

## 2020-02-26 DIAGNOSIS — E669 Obesity, unspecified: Secondary | ICD-10-CM | POA: Diagnosis present

## 2020-02-26 DIAGNOSIS — S065X0A Traumatic subdural hemorrhage without loss of consciousness, initial encounter: Secondary | ICD-10-CM | POA: Diagnosis present

## 2020-02-26 DIAGNOSIS — Z91030 Bee allergy status: Secondary | ICD-10-CM | POA: Diagnosis not present

## 2020-02-26 DIAGNOSIS — I10 Essential (primary) hypertension: Secondary | ICD-10-CM | POA: Diagnosis present

## 2020-02-26 LAB — CBC WITH DIFFERENTIAL/PLATELET
Abs Immature Granulocytes: 0.03 10*3/uL (ref 0.00–0.07)
Basophils Absolute: 0.1 10*3/uL (ref 0.0–0.1)
Basophils Relative: 1 %
Eosinophils Absolute: 0.3 10*3/uL (ref 0.0–0.5)
Eosinophils Relative: 4 %
HCT: 38.5 % — ABNORMAL LOW (ref 39.0–52.0)
Hemoglobin: 13.6 g/dL (ref 13.0–17.0)
Immature Granulocytes: 0 %
Lymphocytes Relative: 17 %
Lymphs Abs: 1.4 10*3/uL (ref 0.7–4.0)
MCH: 32.5 pg (ref 26.0–34.0)
MCHC: 35.3 g/dL (ref 30.0–36.0)
MCV: 91.9 fL (ref 80.0–100.0)
Monocytes Absolute: 0.8 10*3/uL (ref 0.1–1.0)
Monocytes Relative: 10 %
Neutro Abs: 5.3 10*3/uL (ref 1.7–7.7)
Neutrophils Relative %: 68 %
Platelets: 283 10*3/uL (ref 150–400)
RBC: 4.19 MIL/uL — ABNORMAL LOW (ref 4.22–5.81)
RDW: 11.6 % (ref 11.5–15.5)
WBC: 7.9 10*3/uL (ref 4.0–10.5)
nRBC: 0 % (ref 0.0–0.2)

## 2020-02-26 LAB — BASIC METABOLIC PANEL
Anion gap: 9 (ref 5–15)
BUN: 13 mg/dL (ref 8–23)
CO2: 29 mmol/L (ref 22–32)
Calcium: 9.3 mg/dL (ref 8.9–10.3)
Chloride: 101 mmol/L (ref 98–111)
Creatinine, Ser: 1.06 mg/dL (ref 0.61–1.24)
GFR, Estimated: >60 mL/min (ref >60)
Glucose, Bld: 99 mg/dL (ref 70–99)
Potassium: 4 mmol/L (ref 3.5–5.1)
Sodium: 139 mmol/L (ref 135–145)

## 2020-02-26 LAB — LIPID PANEL
Cholesterol: 161 mg/dL (ref 0–200)
HDL: 54 mg/dL (ref >40)
LDL Cholesterol: 81 mg/dL (ref 0–99)
Total CHOL/HDL Ratio: 3 RATIO
Triglycerides: 128 mg/dL (ref <150)
VLDL: 26 mg/dL (ref 0–40)

## 2020-02-26 LAB — HIV ANTIBODY (ROUTINE TESTING W REFLEX): HIV Screen 4th Generation wRfx: NONREACTIVE

## 2020-02-26 MED ORDER — UMECLIDINIUM BROMIDE 62.5 MCG/INH IN AEPB
1.0000 | INHALATION_SPRAY | Freq: Every day | RESPIRATORY_TRACT | Status: DC
  Administered 2020-02-26 – 2020-02-27 (×2): 1 via RESPIRATORY_TRACT
  Filled 2020-02-26: qty 7

## 2020-02-26 MED ORDER — DOCUSATE SODIUM 100 MG PO CAPS: 100.0000 mg | ORAL_CAPSULE | Freq: Every day | ORAL | Status: DC | PRN

## 2020-02-26 MED ORDER — SODIUM CHLORIDE 0.9 % IV SOLN
INTRAVENOUS | Status: DC | PRN
  Administered 2020-02-26 (×2): 250 mL via INTRAVENOUS

## 2020-02-26 MED ORDER — TAMSULOSIN HCL 0.4 MG PO CAPS
0.4000 mg | ORAL_CAPSULE | Freq: Every day | ORAL | Status: DC
  Administered 2020-02-26 – 2020-02-27 (×2): 0.4 mg via ORAL
  Filled 2020-02-26 (×2): qty 1

## 2020-02-26 MED ORDER — METHYLPREDNISOLONE SODIUM SUCC 40 MG IJ SOLR
40.0000 mg | Freq: Every day | INTRAMUSCULAR | Status: DC
  Administered 2020-02-26 – 2020-02-27 (×2): 40 mg via INTRAVENOUS
  Filled 2020-02-26 (×2): qty 1

## 2020-02-26 MED ORDER — TRAMADOL HCL 50 MG PO TABS
50.0000 mg | ORAL_TABLET | Freq: Four times a day (QID) | ORAL | Status: DC | PRN
  Administered 2020-02-26 (×3): 50 mg via ORAL
  Filled 2020-02-26 (×3): qty 1

## 2020-02-26 MED ORDER — FAMOTIDINE 20 MG PO TABS
20.0000 mg | ORAL_TABLET | Freq: Two times a day (BID) | ORAL | Status: DC
  Administered 2020-02-26 – 2020-02-27 (×2): 20 mg via ORAL
  Filled 2020-02-26 (×2): qty 1

## 2020-02-26 MED ORDER — SODIUM CHLORIDE 0.9 % IV SOLN
3.0000 g | Freq: Four times a day (QID) | INTRAVENOUS | Status: DC
  Administered 2020-02-26 – 2020-02-27 (×4): 3 g via INTRAVENOUS
  Filled 2020-02-26: qty 3
  Filled 2020-02-26 (×2): qty 1.32
  Filled 2020-02-26 (×3): qty 8

## 2020-02-26 MED ORDER — LORATADINE 10 MG PO TABS
10.0000 mg | ORAL_TABLET | Freq: Every day | ORAL | Status: DC
  Administered 2020-02-26 – 2020-02-27 (×2): 10 mg via ORAL
  Filled 2020-02-26 (×2): qty 1

## 2020-02-26 NOTE — Progress Notes (Addendum)
PROGRESS NOTE  Brian RongDavid Orr GNF:[id number] DOB: [birth year] DOA: 02/25/2020 PCP: [place], [facility]  HPI/Recap of past 24 hours: HPI from Dr Lytle MichaelsPatel Delshon Orr is a 69 y.o. male with history of pulm fibrosis, HTN, HLD, seen in ed for fall and hurting his jaw and head and head ct shows Anterior falcine subdural hematoma, small areas of focal subarachnoid hemorrhage with possible parotitis.  Patient stated that he got up to get water to drink and felt very dizzy and fell, hit his head, but did not lose consciousness. Per ed md PT had ekg changes with twi in lead III and unsure if this is cardiac related.  Of note, patient does have a history of hypertension, was on HCTZ and lisinopril was recently added on.  Patient admitted for further management.     Today, discussed extensively with patient and wife at bedside, ambulated with PT in the hallway with no issues.  Denies any chest pain, worsening shortness of breath, abdominal pain, nausea/vomiting, fever/chills.  Noted some tenderness around his scalp, left cheek tenderness and swelling still persistent.    Assessment/Plan: Principal Problem:   Fall at home, initial encounter Active Problems:   Subdural hematoma (HCC)   Cellulitis   Fall   Presyncope/fall ?Orthostatic hypotension, ?rule out cardiac origin Orthostatic vitals pending Troponin WNL, EKG with no acute ST changes CT head showed subdural hematoma measuring 3 mm in thickness, small areas of focal subarachnoid hemorrhage, small right occipital scalp hematoma Echo pending Bilateral carotid showed estimated degree of stenosis in left internal carotid artery is 50 to 69%, less than 50% in the right internal carotid artery S/p IV fluids Fall precautions, PT/OT  Subdural hematoma/subarachnoid hemorrhage S/p fall CT head as above EDP spoke to Dr. Adriana Simasook neurosurgery, who recommends repeat CT, no aspirin for a week and if no change to follow-up in about 2 to 3  weeks Repeat MRI no worsening subarachnoid hemorrhage or subdural hematoma noted, no acute CVA noted Neurochecks Follow-up with neurosurgery in about 2 to 3 weeks with repeat imaging  Left facial swelling ?Left parotitis CT maxillofacial with contrast showed possible fat stranding of the left lower face near the tail of the parotid gland, no sialolithiasis obvious source of infection Sent secure chart to ENT for further recommendation Continue IV Unasyn for now Monitor closely, pain management  Hypertension BP stable Patient is on hydrochlorothiazide, recently added lisinopril, no mention of hypotension on admission Continue to hold HCTZ, lisinopril pending BP  Abnormal x-ray Chest x-ray showed 2 cm right upper lobe nodule, likely artifact CT chest negative for any nodule, most likely artifact from EKG lead, noted fibrotic lung  Pulmonary fibrosis Continue nebs, inhalers Supplemental O2 at bedtime  Obesity Lifestyle modification advised       Malnutrition Type:      Malnutrition Characteristics:      Nutrition Interventions:       Estimated body mass index is 31.16 kg/m as calculated from the following:   Height as of this encounter: 5\' 9"  (1.753 m).   Weight as of this encounter: 95.7 kg.     Code Status: Full  Family Communication: Discussed with wife at bedside on 02/26/2020  Disposition Plan: Status is: Observation  The patient will require care spanning > 2 midnights and should be moved to inpatient because: Inpatient level of care appropriate due to severity of illness  Dispo: The patient is from: Home              Anticipated  d/c is to: Home              Anticipated d/c date is: 1 day              Patient currently is not medically stable to d/c.    Consultants:  None  Procedures:  None  Antimicrobials:  Unasyn  DVT prophylaxis: SCDs for now   Objective: Vitals:   02/26/20 0722 02/26/20 1308 02/26/20 1313 02/26/20 1315  BP:  (!) 160/65 115/62 125/69 109/70  Pulse: 68 82 93 100  Resp: 18 16    Temp: 97.9 F (36.6 C) 98.9 F (37.2 C)    TempSrc: Oral Oral    SpO2: 97% 94% 93% 98%  Weight:      Height:        Intake/Output Summary (Last 24 hours) at 02/26/2020 1450 Last data filed at 02/26/2020 1350 Gross per 24 hour  Intake 360 ml  Output --  Net 360 ml   Filed Weights   02/25/20 1744 02/26/20 0115  Weight: 94.3 kg 95.7 kg    Exam:  General: NAD, left cheek swelling, with tenderness  Cardiovascular: S1, S2 present  Respiratory: Fine inspiratory crackles noted  Abdomen: Soft, nontender, nondistended, bowel sounds present  Musculoskeletal: No bilateral pedal edema noted  Skin: Normal  Psychiatry: Normal mood    Data Reviewed: CBC: Recent Labs  Lab 02/25/20 1752 02/26/20 0652  WBC 9.5 7.9  NEUTROABS  --  5.3  HGB 14.0 13.6  HCT 39.9 38.5*  MCV 92.1 91.9  PLT 296 283   Basic Metabolic Panel: Recent Labs  Lab 02/25/20 1752 02/26/20 0652  NA 139 139  K 3.4* 4.0  CL 99 101  CO2 27 29  GLUCOSE 121* 99  BUN 14 13  CREATININE 1.07 1.06  CALCIUM 9.3 9.3   GFR: Estimated Creatinine Clearance: 77.2 mL/min (by C-G formula based on SCr of 1.06 mg/dL). Liver Function Tests: No results for input(s): AST, ALT, ALKPHOS, BILITOT, PROT, ALBUMIN in the last 168 hours. No results for input(s): LIPASE, AMYLASE in the last 168 hours. No results for input(s): AMMONIA in the last 168 hours. Coagulation Profile: No results for input(s): INR, PROTIME in the last 168 hours. Cardiac Enzymes: No results for input(s): CKTOTAL, CKMB, CKMBINDEX, TROPONINI in the last 168 hours. BNP (last 3 results) No results for input(s): PROBNP in the last 8760 hours. HbA1C: No results for input(s): HGBA1C in the last 72 hours. CBG: No results for input(s): GLUCAP in the last 168 hours. Lipid Profile: Recent Labs    02/26/20 0652  CHOL 161  HDL 54  LDLCALC 81  TRIG 128  CHOLHDL 3.0   Thyroid  Function Tests: No results for input(s): TSH, T4TOTAL, FREET4, T3FREE, THYROIDAB in the last 72 hours. Anemia Panel: No results for input(s): VITAMINB12, FOLATE, FERRITIN, TIBC, IRON, RETICCTPCT in the last 72 hours. Urine analysis:    Component Value Date/Time   COLORURINE ORANGE 01/04/2014 1405   APPEARANCEUR HAZY 01/04/2014 1405   LABSPEC SEE COMMENT 01/04/2014 1405   PHURINE SEE COMMENT 01/04/2014 1405   GLUCOSEU SEE COMMENT 01/04/2014 1405   HGBUR SEE COMMENT 01/04/2014 1405   BILIRUBINUR SEE COMMENT 01/04/2014 1405   KETONESUR SEE COMMENT 01/04/2014 1405   PROTEINUR SEE COMMENT 01/04/2014 1405   NITRITE SEE COMMENT 01/04/2014 1405   LEUKOCYTESUR SEE COMMENT 01/04/2014 1405   Sepsis Labs: @LABRCNTIP (procalcitonin:4,lacticidven:4)  ) Recent Results (from the past 240 hour(s))  Respiratory Panel by RT PCR (Flu A&B, Covid) -  Nasopharyngeal Swab     Status: None   Collection Time: 02/25/20  8:47 PM   Specimen: Nasopharyngeal Swab  Result Value Ref Range Status   SARS Coronavirus 2 by RT PCR NEGATIVE NEGATIVE Final    Comment: (NOTE) SARS-CoV-2 target nucleic acids are NOT DETECTED.  The SARS-CoV-2 RNA is generally detectable in upper respiratoy specimens during the acute phase of infection. The lowest concentration of SARS-CoV-2 viral copies this assay can detect is 131 copies/mL. A negative result does not preclude SARS-Cov-2 infection and should not be used as the sole basis for treatment or other patient management decisions. A negative result may occur with  improper specimen collection/handling, submission of specimen other than nasopharyngeal swab, presence of viral mutation(s) within the areas targeted by this assay, and inadequate number of viral copies (<131 copies/mL). A negative result must be combined with clinical observations, patient history, and epidemiological information. The expected result is Negative.  Fact Sheet for Patients:    https://www.moore.com/  Fact Sheet for Healthcare Providers:  https://www.young.biz/  This test is no t yet approved or cleared by the [place] FDA and  has been authorized for detection and/or diagnosis of SARS-CoV-2 by FDA under an Emergency Use Authorization (EUA). This EUA will remain  in effect (meaning this test can be used) for the duration of the COVID-19 declaration under Section 564(b)(1) of the Act, 21 U.S.C. section 360bbb-3(b)(1), unless the authorization is terminated or revoked sooner.     Influenza A by PCR NEGATIVE NEGATIVE Final   Influenza B by PCR NEGATIVE NEGATIVE Final    Comment: (NOTE) The Xpert Xpress SARS-CoV-2/FLU/RSV assay is intended as an aid in  the diagnosis of influenza from Nasopharyngeal swab specimens and  should not be used as a sole basis for treatment. Nasal washings and  aspirates are unacceptable for Xpert Xpress SARS-CoV-2/FLU/RSV  testing.  Fact Sheet for Patients: https://www.moore.com/  Fact Sheet for Healthcare Providers: https://www.young.biz/  This test is not yet approved or cleared by the [place] FDA and  has been authorized for detection and/or diagnosis of SARS-CoV-2 by  FDA under an Emergency Use Authorization (EUA). This EUA will remain  in effect (meaning this test can be used) for the duration of the  Covid-19 declaration under Section 564(b)(1) of the Act, 21  U.S.C. section 360bbb-3(b)(1), unless the authorization is  terminated or revoked. Performed at [facility], [address] [place], [place] [address]       Studies: DG Chest 2 View  Result Date: 02/25/2020 CLINICAL DATA:  69 year old male with fall. EXAM: CHEST - 2 VIEW COMPARISON:  Chest radiograph dated 07/04/2017 FINDINGS: Emphysema with chronic interstitial coarsening. There is a 2 cm right upper lobe nodule, new since the prior radiograph. Further  evaluation with chest CT is recommended. No consolidative changes. There is no pleural effusion pneumothorax. The cardiac silhouette is within limits. No acute osseous pathology. Degenerative changes of the spine. IMPRESSION: 1. No acute cardiopulmonary process.  Emphysema. 2. A 2 cm right upper lobe nodule. Further evaluation with chest CT is recommended. Electronically Signed   By: Elgie Collard M.D.   On: 02/25/2020 19:45   CT Head Wo Contrast  Result Date: 02/25/2020 CLINICAL DATA:  Dizzy, fell EXAM: CT HEAD WITHOUT CONTRAST TECHNIQUE: Contiguous axial images were obtained from the base of the skull through the vertex without intravenous contrast. COMPARISON:  None. FINDINGS: Brain: There is a small subdural hematoma along the anterior falx, measuring approximately 3 mm in thickness. Small  hyperdense areas are seen within the medial aspect the left frontal lobe, consistent with small foci of subarachnoid hemorrhage. No mass effect. No acute infarct. Lateral ventricles are unremarkable. Vascular: No hyperdense vessel or unexpected calcification. Skull: Small right occipital scalp hematoma. No underlying fracture. The remainder of the calvarium is unremarkable. Sinuses/Orbits: No acute finding. Other: None. IMPRESSION: 1. Anterior falcine subdural hematoma measuring 3 mm in thickness. 2. Small areas of focal subarachnoid hemorrhage along the medial aspect of the left frontal lobe. 3. Small right occipital scalp hematoma.  No underlying fracture. Critical Value/emergent results were called by telephone at the time of interpretation on 02/25/2020 at 7:06 pm to provider [facility] , who verbally acknowledged these results. Electronically Signed   By: Sharlet Salina M.D.   On: 02/25/2020 19:06   CT CHEST WO CONTRAST  Result Date: 02/26/2020 CLINICAL DATA:  69 year old male with possible 2 cm right upper lobe lung nodule on recent radiographs. EXAM: CT CHEST WITHOUT CONTRAST TECHNIQUE: Multidetector CT  imaging of the chest was performed following the standard protocol without IV contrast. COMPARISON:  Radiographs 02/25/2020.  Chest CT 08/21/2013. FINDINGS: Cardiovascular: Calcified aortic atherosclerosis. Calcified coronary artery atherosclerosis. Normal caliber thoracic aorta. Vascular patency is not evaluated in the absence of IV contrast. No cardiomegaly or pericardial effusion. Mediastinum/Nodes: No mediastinal lymphadenopathy. Small gastric hiatal hernia is new since 2015. Lungs/Pleura: Chronic lung disease with underlying emphysema but increased architectural distortion in the right upper lobe since 2015 (series 4, image 44). Increasing bilateral lower lobe and lingula/middle lobe subpleural reticular opacity and honeycombing. Some associated bilateral bronchiectasis. Globular retained secretions in the trachea just above the carina on series 4, image 48. Otherwise the major airways remain patent. No pleural effusion. No 2 cm right lung lesion radiographic appearance to correspond to the recent radiographic appearance which I favor was are artifact due to right chest EKG button. No suspicious pulmonary nodule elsewhere. Upper Abdomen: Negative visible noncontrast liver, gallbladder, spleen, pancreas, adrenal glands, kidneys, and bowel in the upper abdomen. Musculoskeletal: Osteopenia and degenerative changes. Benign vertebral body hemangioma suspected at T11. No acute or suspicious osseous lesion identified. IMPRESSION: 1. No right lung lesion to correspond to the recent radiographic appearance which I favor was artifact due to a right chest EKG button. 2. But there is underlying chronic lung disease with progression since 2015 including lower lobe Bronchiectasis, Emphysema (ICD10-J43.9), increasing architectural distortion and honeycombing. 3. Aortic Atherosclerosis (ICD10-I70.0). Calcified coronary artery atherosclerosis. Electronically Signed   By: Odessa Fleming M.D.   On: 02/26/2020 12:21   CT Cervical Spine  Wo Contrast  Result Date: 02/25/2020 CLINICAL DATA:  69 year old male with head trauma. EXAM: CT CERVICAL SPINE WITHOUT CONTRAST TECHNIQUE: Multidetector CT imaging of the cervical spine was performed without intravenous contrast. Multiplanar CT image reconstructions were also generated. COMPARISON:  None. FINDINGS: Alignment: No acute subluxation. Skull base and vertebrae: No acute fracture. Osteopenia. Soft tissues and spinal canal: No prevertebral fluid or swelling. No visible canal hematoma. Disc levels: Multilevel degenerative changes with endplate irregularity and disc space narrowing. Upper chest: Emphysema. Other: Bilateral carotid bulb calcified plaques. IMPRESSION: 1. No acute/traumatic cervical spine pathology. 2. Multilevel degenerative changes. Electronically Signed   By: Elgie Collard M.D.   On: 02/25/2020 18:57   MR BRAIN WO CONTRAST  Result Date: 02/26/2020 CLINICAL DATA:  Syncope EXAM: MRI HEAD WITHOUT CONTRAST TECHNIQUE: Multiplanar, multiecho pulse sequences of the brain and surrounding structures were obtained without intravenous contrast. COMPARISON:  None. FINDINGS: Brain: Small amount of  acute subdural hemorrhage along the anterior falx cerebri. Small amount of adjacent subarachnoid hemorrhage also unchanged. Multifocal hyperintense T2-weighted signal within the white matter. Normal volume of CSF spaces. No chronic microhemorrhage. Normal midline structures. Vascular: Normal flow voids. Skull and upper cervical spine: Normal marrow signal. Sinuses/Orbits: Negative. Other: None. IMPRESSION: 1. Small amount of acute subdural hemorrhage along the anterior falx cerebri. Small amount of adjacent subarachnoid hemorrhage also unchanged. 2. Findings of chronic microvascular ischemia. Electronically Signed   By: Deatra Robinson M.D.   On: 02/26/2020 01:02   US Carotid Bilateral  Result Date: 02/26/2020 CLINICAL DATA:  69 year old with syncope. EXAM: BILATERAL CAROTID DUPLEX ULTRASOUND  TECHNIQUE: Wallace Cullens scale imaging, color Doppler and duplex ultrasound were performed of bilateral carotid and vertebral arteries in the neck. COMPARISON:  Brain MRI 02/26/2020 FINDINGS: Criteria: Quantification of carotid stenosis is based on velocity parameters that correlate the residual internal carotid diameter with NASCET-based stenosis levels, using the diameter of the distal internal carotid lumen as the denominator for stenosis measurement. The following velocity measurements were obtained: RIGHT ICA: 97/32 cm/sec CCA: 87/18 cm/sec SYSTOLIC ICA/CCA RATIO:  1.1 ECA: 149 cm/sec LEFT ICA: 192/43 cm/sec CCA: 95/21 cm/sec SYSTOLIC ICA/CCA RATIO:  2.0 ECA: 147 cm/sec RIGHT CAROTID ARTERY: Echogenic plaque with shadowing at the right carotid bulb. External carotid artery is patent with normal waveform. Extensive echogenic plaque with shadowing in the internal carotid artery. Normal waveforms and velocities in the internal carotid artery. RIGHT VERTEBRAL ARTERY: Antegrade flow and normal waveform in the right vertebral artery. LEFT CAROTID ARTERY: Scattered plaque in the left common carotid artery. Echogenic plaque with extensive posterior acoustic shadowing at the left carotid bulb. External carotid artery is patent with normal waveform. Extensive echogenic plaque with posterior acoustic shadowing in the proximal internal carotid artery. Limited visualization of the internal carotid artery due to the posterior acoustic shadowing. Peak systolic velocity in the left internal carotid artery is elevated measuring up to 192 cm/sec. Distal left internal carotid artery is patent. LEFT VERTEBRAL ARTERY: Antegrade flow and normal waveform in the left vertebral artery. IMPRESSION: 1. Atherosclerotic plaque involving bilateral carotid arteries. Portions of the carotid arteries are not visualized due to posterior acoustic shadowing from the echogenic plaque. 2. Estimated degree of stenosis in left internal carotid artery is  50-69%. 3. Estimated degree of stenosis in right internal carotid artery is less than 50%. 4. Patent vertebral arteries with antegrade flow. Electronically Signed   By: Richarda Overlie M.D.   On: 02/26/2020 08:02   CT Maxillofacial W Contrast  Result Date: 02/25/2020 CLINICAL DATA:  Left facial pain EXAM: CT MAXILLOFACIAL WITH CONTRAST TECHNIQUE: Multidetector CT imaging of the maxillofacial structures was performed with intravenous contrast. Multiplanar CT image reconstructions were also generated. CONTRAST:  58mL OMNIPAQUE IOHEXOL 300 MG/ML  SOLN COMPARISON:  None. FINDINGS: Osseous: No fracture or mandibular dislocation. No destructive process. Orbits: Negative. No traumatic or inflammatory finding. Sinuses: Clear. Soft tissues: There is mild thickening of the platysma and subcutaneous fat stranding of the lower left face, near the tail of the parotid gland. No sialolithiasis. No obvious source of infection. Limited intracranial: Normal IMPRESSION: Mild thickening of the platysma and subcutaneous fat stranding of the lower left face, near the tail of the parotid gland, likely indicating cellulitis. No sialolithiasis or obvious source of infection. Electronically Signed   By: Deatra Robinson M.D.   On: 02/25/2020 20:56    Scheduled Meds:  Continuous Infusions: . sodium chloride 250 mL (02/26/20 0757)  . ampicillin-sulbactam (  UNASYN) IV       LOS: 0 days     Briant Cedar, MD [facility]  If 7PM-7AM, please contact night-coverage [email or web address] 02/26/2020, 2:50 PM

## 2020-02-26 NOTE — Progress Notes (Signed)
OT Cancellation Note  Patient Details Name: Brian Orr MRN: [id number] DOB: 1950-12-26   Cancelled Treatment:    Reason Eval/Treat Not Completed: OT screened, no needs identified, will sign off  Upon chart review and in speaking with RN and patient/patient's family, do not detect any acute occupational therapy needs at this time. Reporting that pt is performing self care and fxl mobility at his functional baseline at this time. Will complete order and sign off. Thank you.  Rejeana Brock, MS, OTR/L ascom [phone] 02/26/20, 9:22 AM

## 2020-02-26 NOTE — Progress Notes (Signed)
*  PRELIMINARY RESULTS* °Echocardiogram °2D Echocardiogram has been performed. ° °Brian Orr Brian Orr °02/26/2020, 6:26 PM °

## 2020-02-26 NOTE — Progress Notes (Signed)
Physical Therapy Evaluation Patient Details Name: Brian Orr MRN: [id number] DOB: [birth year] Today's Date: 02/26/2020   History of Present Illness  Per MD note:Brian Orr is a 69 y.o. male with no significant pmh seen in ed for fall and hurting his jaw and head and head ct shows Anterior falcine subdural hematoma measuring 3 mm in thickness. 2. Small areas of focal subarachnoid hemorrhage along the medial aspect of the left frontal lobe. 3. Small right occipital scalp hematoma.  No underlying fracture, also Mild thickening of the platysma and subcutaneous fat stranding of the lower left face, near the tail of the parotid gland, likely indicating cellulitis.  Clinical Impression  Patient agrees to PT eval. He reports that he tried to eat his breakfast and his L cheek is now having severe pain 8/10.  He has WFL BLE and BUE strength.  He has normal  sitting balance and standing balance. He is I with bed mobility, transfers and ambulates 200 feet without AD. He has no skilled PT needs at this time and will be discharged from PT.     Follow Up Recommendations No PT follow up    Equipment Recommendations  None recommended by PT    Recommendations for Other Services       Precautions / Restrictions Precautions Precautions: Fall Restrictions Weight Bearing Restrictions: No      Mobility  Bed Mobility Overal bed mobility: Independent             General bed mobility comments: needs assist for IV lines    Transfers Overall transfer level: Independent Equipment used: None             General transfer comment: needs assist for IV lines  Ambulation/Gait Ambulation/Gait assistance: Independent Gait Distance (Feet): 200 Feet Assistive device: None Gait Pattern/deviations: Step-through pattern        Stairs            Wheelchair Mobility    Modified Rankin (Stroke Patients Only)       Balance Overall balance assessment: Independent                                            Pertinent Vitals/Pain Pain Assessment: 0-10 Pain Score: 8  Pain Location:  (face) Pain Descriptors / Indicators: Aching    Home Living Family/patient expects to be discharged to:: Private residence Living Arrangements: Spouse/significant other Available Help at Discharge: Family Type of Home: House Home Access: Stairs to enter Entrance Stairs-Rails: Right Entrance Stairs-Number of Steps: 2          Prior Function Level of Independence: Independent               Hand Dominance   Dominant Hand: Right    Extremity/Trunk Assessment   Upper Extremity Assessment Upper Extremity Assessment: Overall WFL for tasks assessed    Lower Extremity Assessment Lower Extremity Assessment: Overall WFL for tasks assessed       Communication   Communication: No difficulties  Cognition Arousal/Alertness: Awake/alert Behavior During Therapy: WFL for tasks assessed/performed Overall Cognitive Status: Within Functional Limits for tasks assessed                                        General Comments      Exercises  Assessment/Plan    PT Assessment    PT Problem List         PT Treatment Interventions      PT Goals (Current goals can be found in the Care Plan section)  Acute Rehab PT Goals Patient Stated Goal: to go home and have his face not hurt PT Goal Formulation: All assessment and education complete, DC therapy    Frequency     Barriers to discharge        Co-evaluation               AM-PAC PT "6 Clicks" Mobility  Outcome Measure Help needed turning from your back to your side while in a flat bed without using bedrails?: None Help needed moving from lying on your back to sitting on the side of a flat bed without using bedrails?: None Help needed moving to and from a bed to a chair (including a wheelchair)?: None Help needed standing up from a chair using your arms (e.g., wheelchair or  bedside chair)?: None Help needed to walk in hospital room?: None Help needed climbing 3-5 steps with a railing? : None 6 Click Score: 24    End of Session Equipment Utilized During Treatment: Gait belt Activity Tolerance: Patient tolerated treatment well Patient left: in bed;with bed alarm set Nurse Communication: Mobility status;Other (comment) (unable to eat due to pain, asked for ice)      Time: 1245-8099 PT Time Calculation (min) (ACUTE ONLY): 10 min   Charges:   PT Evaluation $PT Eval Low Complexity: 1 Low            Brian Orr, PT DPT 02/26/2020, 9:44 AM

## 2020-02-26 NOTE — TOC Initial Note (Signed)
Transition of Care [facility]) - Initial/Assessment Note    Patient Details  Name: Brian Orr MRN: [id number] Date of Birth: [birth year]  Transition of Care [facility]) CM/SW Contact:    Allayne Butcher, RN Phone Number: 02/26/2020, 9:54 AM  Clinical Narrative:                 Patient placed under observation for syncopal episode at home.  Patient lives with his wife and reports that he is very independent at home.  Patient drives and works around the house and yard.  Patient is current with PCP and gets prescriptions Mail Order from [facility] for immediate prescription needs the patient uses [facility].  Patient will likely discharge this afternoon after ECHO and CT completed.  Wife is at the bedside.  No discharge needs identified.    Expected Discharge Plan: Home/Self Care Barriers to Discharge: Continued Medical Work up   Patient Goals and CMS Choice Patient states their goals for this hospitalization and ongoing recovery are:: To get home after testing completed.      Expected Discharge Plan and Services Expected Discharge Plan: Home/Self Care       Living arrangements for the past 2 months: Single Family Home                   DME Agency: NA       HH Arranged: NA          Prior Living Arrangements/Services Living arrangements for the past 2 months: Single Family Home Lives with:: Spouse Patient language and need for interpreter reviewed:: Yes Do you feel safe going back to the place where you live?: Yes      Need for Family Participation in Patient Care: Yes (Comment) (fall, dizziness) Care giver support system in place?: Yes (comment) (wife)   Criminal Activity/Legal Involvement Pertinent to Current Situation/Hospitalization: No - Comment as needed  Activities of Daily Living Home Assistive Devices/Equipment: None ADL Screening (condition at time of admission) Patient's cognitive ability adequate to safely complete daily activities?: Yes Is the patient deaf or have  difficulty hearing?: No Does the patient have difficulty seeing, even when wearing glasses/contacts?: No Does the patient have difficulty concentrating, remembering, or making decisions?: No Patient able to express need for assistance with ADLs?: Yes Does the patient have difficulty dressing or bathing?: No Independently performs ADLs?: Yes (appropriate for developmental age) Does the patient have difficulty walking or climbing stairs?: No Weakness of Legs: None Weakness of Arms/Hands: None  Permission Sought/Granted Permission sought to share information with : Case Manager, Family Supports Permission granted to share information with : Yes, Verbal Permission Granted  Share Information with NAME: [name]     Permission granted to share info w Relationship: wife     Emotional Assessment Appearance:: Appears stated age Attitude/Demeanor/Rapport: Engaged Affect (typically observed): Accepting Orientation: : Oriented to Self, Oriented to Place, Oriented to  Time, Oriented to Situation Alcohol / Substance Use: Not Applicable Psych Involvement: No (comment)  Admission diagnosis:  Subarachnoid hemorrhage (HCC) [I60.9] Subdural hemorrhage (HCC) [I62.00] Parotitis [K11.20] Syncope [R55] Abnormal EKG [R94.31] Fall [W19.XXXA] Near syncope [R55] Patient Active Problem List   Diagnosis Date Noted  . Fall at home, initial encounter 02/25/2020  . Subdural hematoma (HCC) 02/25/2020  . Cellulitis 02/25/2020  . Fall 02/25/2020   PCP:  [facility]:  No Pharmacies Listed    Social Determinants of Health (SDOH) Interventions    Readmission Risk Interventions No flowsheet data found.

## 2020-02-26 NOTE — Care Management Obs Status (Signed)
MEDICARE OBSERVATION STATUS NOTIFICATION   Patient Details  Name: Brian Orr MRN: [id number] Date of Birth: 1951/02/19   Medicare Observation Status Notification Given:  Yes    Allayne Butcher, RN 02/26/2020, 9:52 AM

## 2020-02-27 LAB — ECHOCARDIOGRAM COMPLETE
AR max vel: 2.62 cm2
AV Area VTI: 2.83 cm2
AV Area mean vel: 2.56 cm2
AV Mean grad: 4 mmHg
AV Peak grad: 8.9 mmHg
Ao pk vel: 1.49 m/s
Area-P 1/2: 7.37 cm2
Height: 69 in
S' Lateral: 2.59 cm
Weight: 3375.68 oz

## 2020-02-27 LAB — BASIC METABOLIC PANEL
Anion gap: 7 (ref 5–15)
BUN: 15 mg/dL (ref 8–23)
CO2: 29 mmol/L (ref 22–32)
Calcium: 8.9 mg/dL (ref 8.9–10.3)
Chloride: 101 mmol/L (ref 98–111)
Creatinine, Ser: 0.87 mg/dL (ref 0.61–1.24)
GFR, Estimated: >60 mL/min (ref >60)
Glucose, Bld: 143 mg/dL — ABNORMAL HIGH (ref 70–99)
Potassium: 4.7 mmol/L (ref 3.5–5.1)
Sodium: 137 mmol/L (ref 135–145)

## 2020-02-27 LAB — CBC WITH DIFFERENTIAL/PLATELET
Abs Immature Granulocytes: 0.03 10*3/uL (ref 0.00–0.07)
Basophils Absolute: 0 10*3/uL (ref 0.0–0.1)
Basophils Relative: 0 %
Eosinophils Absolute: 0 10*3/uL (ref 0.0–0.5)
Eosinophils Relative: 0 %
HCT: 38 % — ABNORMAL LOW (ref 39.0–52.0)
Hemoglobin: 13.8 g/dL (ref 13.0–17.0)
Immature Granulocytes: 0 %
Lymphocytes Relative: 10 %
Lymphs Abs: 0.8 10*3/uL (ref 0.7–4.0)
MCH: 33 pg (ref 26.0–34.0)
MCHC: 36.3 g/dL — ABNORMAL HIGH (ref 30.0–36.0)
MCV: 90.9 fL (ref 80.0–100.0)
Monocytes Absolute: 0.1 10*3/uL (ref 0.1–1.0)
Monocytes Relative: 2 %
Neutro Abs: 6.9 10*3/uL (ref 1.7–7.7)
Neutrophils Relative %: 88 %
Platelets: 294 10*3/uL (ref 150–400)
RBC: 4.18 MIL/uL — ABNORMAL LOW (ref 4.22–5.81)
RDW: 11.2 % — ABNORMAL LOW (ref 11.5–15.5)
WBC: 7.8 10*3/uL (ref 4.0–10.5)
nRBC: 0 % (ref 0.0–0.2)

## 2020-02-27 MED ORDER — PREDNISONE 20 MG PO TABS: 40.0000 mg | ORAL_TABLET | Freq: Every day | ORAL | 0 refills | Status: AC

## 2020-02-27 MED ORDER — AMOXICILLIN-POT CLAVULANATE 875-125 MG PO TABS: 1.0000 | ORAL_TABLET | Freq: Two times a day (BID) | ORAL | 0 refills | Status: AC

## 2020-02-27 NOTE — Progress Notes (Signed)
Pt dc instructions give and pt verbalizes understanding, iv removed pt tolerated well, pt to be dc'd via w/c upon wife's arrival. Pt in apparent stable condition

## 2020-02-27 NOTE — Discharge Summary (Signed)
Discharge Summary  Brian Orr KAJ:[id number] DOB: 06/29/1950  PCP: [facility]  Admit date: 02/25/2020 Discharge date: 02/27/2020  Time spent: 40 mins   Recommendations for Outpatient Follow-up:  1. Follow-up with PCP in 1 week 2. Follow-up with neurosurgery in about 2 to 3 weeks for repeat CT head 3. Follow-up with outpatient ENT    Discharge Diagnoses:  Active Hospital Problems   Diagnosis Date Noted  . Fall at home, initial encounter 02/25/2020  . Subdural hematoma (HCC) 02/25/2020  . Cellulitis 02/25/2020  . Fall 02/25/2020    Resolved Hospital Problems  No resolved problems to display.    Discharge Condition: Stable  Diet recommendation: Heart healthy   Vitals:   02/27/20 0428 02/27/20 0730  BP: 126/69 132/70  Pulse: 74 69  Resp: 16 17  Temp: 97.7 F (36.5 C) 98.4 F (36.9 C)  SpO2: 94% 95%    History of present illness:  Brian Richardis a 69 y.o.malewith history of pulm fibrosis, HTN, HLD, seen in ed for fall and hurting his jaw and head and head ct showsAnterior falcine subdural hematoma, small areas of focal subarachnoid hemorrhage with possible parotitis.  Patient stated that he got up to get water to drink and felt very dizzy and fell, hit his head, but did not lose consciousness. Per ed md PT had ekg changes with twi in lead III and unsure if this is cardiac related.  Of note, patient does have a history of hypertension, was on HCTZ and lisinopril was recently added on. CT maxillofacial showed possible parotitis.  Patient admitted for further management.    Today, patient denied any new complaints, reports left facial swelling and tenderness have improved significantly, was able to eat without any significant problems, eyes any fever/chills, abdominal pain, nausea/vomiting, chest pain, shortness of breath.  Patient able to ambulate to the bathroom and back as well as in the hallway without any dizziness.  Patient very eager to be  discharged. Discussed discharge plans extensively with patient and wife over the phone.  Advised to follow-up with PCP, ENT as well as neurosurgery.   Hospital Course:  Principal Problem:   Fall at home, initial encounter Active Problems:   Subdural hematoma (HCC)   Cellulitis   Fall   Presyncope/fall ?Orthostatic hypotension, ruled out cardiac origin Orthostatic vitals negative status post IV fluid hydration Troponin WNL, EKG with no acute ST changes CT head showed subdural hematoma measuring 3 mm in thickness, small areas of focal subarachnoid hemorrhage, small right occipital scalp hematoma Echo with EF of 60 to 65%, no regional wall motion abnormalities, left ventricular diastolic parameters were normal Bilateral carotid showed estimated degree of stenosis in left internal carotid artery is 50 to 69%, less than 50% in the right internal carotid artery PT/OT-no further recommendations Fall precautions  Subdural hematoma/subarachnoid hemorrhage S/p fall CT head as above EDP spoke to Dr. Adriana Simas neurosurgery on 02/25/2020, who recommends repeat CT, no aspirin for a week and if no change to follow-up in about 2 to 3 weeks Repeat MRI no worsening subarachnoid hemorrhage or subdural hematoma noted, no acute CVA noted Follow-up with neurosurgery in about 2 to 3 weeks with repeat imaging, avoid aspirin/NSAIDs  Left facial swelling ?Left parotitis CT maxillofacial with contrast showed possible fat stranding of the left lower face near the tail of the parotid gland, no sialolithiasis obvious source of infection Discussed with ENT Dr. Jenne Campus on 02/27/2020, stated CT not that impressive, recommend p.o. antibiotics and steroids for further recommendation  S/p IV Unasyn, switched to p.o. Augmentin for total of 10 days, prednisone for a total of 5 days Follow-up with outpatient ENT  Hypertension BP stable Patient is on hydrochlorothiazide, recently added lisinopril, no mention of  hypotension on admission Continue to hold HCTZ (recommend D/C as its notorious for dehydration, orthostatic hypotension), restart lisinopril Outpatient follow-up with PCP  Abnormal x-ray Chest x-ray showed 2 cm right upper lobe nodule, likely artifact CT chest negative for any nodule, most likely artifact from EKG lead, noted fibrotic lung  Pulmonary fibrosis Continue nebs, inhalers Supplemental O2 at bedtime  Obesity Lifestyle modification advised       Malnutrition Type:      Malnutrition Characteristics:      Nutrition Interventions:      Estimated body mass index is 31.16 kg/m as calculated from the following:   Height as of this encounter: 5\' 9"  (1.753 m).   Weight as of this encounter: 95.7 kg.    Procedures: None  Consultations:  Discussed with ENT  EDP discussed with neurosurgery  Discharge Exam: BP 132/70 (BP Location: Right Arm)   Pulse 69   Temp 98.4 F (36.9 C)   Resp 17   Ht 5\' 9"  (1.753 m)   Wt 95.7 kg   SpO2 95%   BMI 31.16 kg/m    General: NAD, noted left-sided facial swelling and tenderness, no erythema noted Cardiovascular: S1, S2 present Respiratory: CTA B    Discharge Instructions You were cared for by a hospitalist during your hospital stay. If you have any questions about your discharge medications or the care you received while you were in the hospital after you are discharged, you can call the unit and asked to speak with the hospitalist on call if the hospitalist that took care of you is not available. Once you are discharged, your primary care physician will handle any further medical issues. Please note that NO REFILLS for any discharge medications will be authorized once you are discharged, as it is imperative that you return to your primary care physician (or establish a relationship with a primary care physician if you do not have one) for your aftercare needs so that they can reassess your need for medications and  monitor your lab values.  Discharge Instructions    Diet - low sodium heart healthy   Complete by: As directed    Increase activity slowly   Complete by: As directed      Allergies as of 02/27/2020      Reactions   Wasp Venom Anaphylaxis   Other    Other reaction(s): Unknown POISON IVY      Medication List    STOP taking these medications   hydrochlorothiazide 25 MG tablet Commonly known as: HYDRODIURIL   naproxen sodium 220 MG tablet Commonly known as: ALEVE     TAKE these medications   amoxicillin-clavulanate 875-125 MG tablet Commonly known as: Augmentin Take 1 tablet by mouth 2 (two) times daily for 10 days.   Cetirizine HCl 10 MG Caps Take 10 mg by mouth daily.   co-enzyme Q-10 50 MG capsule Take 100 mg by mouth daily.   cyanocobalamin 1000 MCG tablet Take 2,500 mcg by mouth daily.   docusate sodium 100 MG capsule Commonly known as: COLACE Take 100 mg by mouth daily as needed for mild constipation.   famotidine 20 MG tablet Commonly known as: PEPCID Take 20 mg by mouth in the morning and at bedtime.   Fish Oil 1200 MG Caps Take  1,200 mg by mouth daily.   hydrocortisone cream 1 % Apply 1 application topically daily as needed. Face   Incruse Ellipta 62.5 MCG/INH Aepb Generic drug: umeclidinium bromide Inhale 1 puff into the lungs daily.   lisinopril 10 MG tablet Commonly known as: ZESTRIL Take 5 mg by mouth daily.   magnesium oxide 400 MG tablet Commonly known as: MAG-OX Take 400 mg by mouth daily.   Multi-Vitamin tablet Take 1 tablet by mouth daily.   niacin 500 MG tablet Take 500 mg by mouth daily.   predniSONE 20 MG tablet Commonly known as: DELTASONE Take 2 tablets (40 mg total) by mouth daily with breakfast for 5 days.   pseudoephedrine 30 MG tablet Commonly known as: SUDAFED Take 30 mg by mouth every 4 (four) hours as needed.   pyridoxine 100 MG tablet Commonly known as: B-6 Take 100 mg by mouth daily.   Saw Palmetto 500 MG  Caps Take 900 mg by mouth daily.   sildenafil 50 MG tablet Commonly known as: VIAGRA Take 50 mg by mouth daily as needed.   tamsulosin 0.4 MG Caps capsule Commonly known as: FLOMAX Take 0.4 mg by mouth daily.   triamcinolone 0.1 % Commonly known as: KENALOG Apply topically.   Zinc Acetate 50 MG Caps Take 50 mg by mouth daily.      Allergies  Allergen Reactions  . Wasp Venom Anaphylaxis  . Other     Other reaction(s): Unknown POISON IVY    Follow-up Information    Cook, Steven, MLucy Chrisle an appointment as soon as possible for a visit in 1 week(s).   Specialty: Neurosurgery Why: To follow up with a repeat CT head Contact information: [address] [place] [address] [phone]        [facility]. Schedule an appointment as soon as possible for a visit in 1 week(s).   Contact information: [address] [place] [address] [phone]                The results of significant diagnostics from this hospitalization (including imaging, microbiology, ancillary and laboratory) are listed below for reference.    Significant Diagnostic Studies: DG Chest 2 View  Result Date: 02/25/2020 CLINICAL DATA:  69 year old male with fall. EXAM: CHEST - 2 VIEW COMPARISON:  Chest radiograph dated 07/04/2017 FINDINGS: Emphysema with chronic interstitial coarsening. There is a 2 cm right upper lobe nodule, new since the prior radiograph. Further evaluation with chest CT is recommended. No consolidative changes. There is no pleural effusion pneumothorax. The cardiac silhouette is within limits. No acute osseous pathology. Degenerative changes of the spine. IMPRESSION: 1. No acute cardiopulmonary process.  Emphysema. 2. A 2 cm right upper lobe nodule. Further evaluation with chest CT is recommended. Electronically Signed   By: Elgie Collard M.D.   On: 02/25/2020 19:45   CT Head Wo Contrast  Result Date: 02/25/2020 CLINICAL DATA:  Dizzy, fell EXAM:  CT HEAD WITHOUT CONTRAST TECHNIQUE: Contiguous axial images were obtained from the base of the skull through the vertex without intravenous contrast. COMPARISON:  None. FINDINGS: Brain: There is a small subdural hematoma along the anterior falx, measuring approximately 3 mm in thickness. Small hyperdense areas are seen within the medial aspect the left frontal lobe, consistent with small foci of subarachnoid hemorrhage. No mass effect. No acute infarct. Lateral ventricles are unremarkable. Vascular: No hyperdense vessel or unexpected calcification. Skull: Small right occipital scalp hematoma. No underlying fracture. The remainder of the calvarium  is unremarkable. Sinuses/Orbits: No acute finding. Other: None. IMPRESSION: 1. Anterior falcine subdural hematoma measuring 3 mm in thickness. 2. Small areas of focal subarachnoid hemorrhage along the medial aspect of the left frontal lobe. 3. Small right occipital scalp hematoma.  No underlying fracture. Critical Value/emergent results were called by telephone at the time of interpretation on 02/25/2020 at 7:06 pm to provider [facility] , who verbally acknowledged these results. Electronically Signed   By: Sharlet Salina M.D.   On: 02/25/2020 19:06   CT CHEST WO CONTRAST  Result Date: 02/26/2020 CLINICAL DATA:  69 year old male with possible 2 cm right upper lobe lung nodule on recent radiographs. EXAM: CT CHEST WITHOUT CONTRAST TECHNIQUE: Multidetector CT imaging of the chest was performed following the standard protocol without IV contrast. COMPARISON:  Radiographs 02/25/2020.  Chest CT 08/21/2013. FINDINGS: Cardiovascular: Calcified aortic atherosclerosis. Calcified coronary artery atherosclerosis. Normal caliber thoracic aorta. Vascular patency is not evaluated in the absence of IV contrast. No cardiomegaly or pericardial effusion. Mediastinum/Nodes: No mediastinal lymphadenopathy. Small gastric hiatal hernia is new since 2015. Lungs/Pleura: Chronic lung disease  with underlying emphysema but increased architectural distortion in the right upper lobe since 2015 (series 4, image 44). Increasing bilateral lower lobe and lingula/middle lobe subpleural reticular opacity and honeycombing. Some associated bilateral bronchiectasis. Globular retained secretions in the trachea just above the carina on series 4, image 48. Otherwise the major airways remain patent. No pleural effusion. No 2 cm right lung lesion radiographic appearance to correspond to the recent radiographic appearance which I favor was are artifact due to right chest EKG button. No suspicious pulmonary nodule elsewhere. Upper Abdomen: Negative visible noncontrast liver, gallbladder, spleen, pancreas, adrenal glands, kidneys, and bowel in the upper abdomen. Musculoskeletal: Osteopenia and degenerative changes. Benign vertebral body hemangioma suspected at T11. No acute or suspicious osseous lesion identified. IMPRESSION: 1. No right lung lesion to correspond to the recent radiographic appearance which I favor was artifact due to a right chest EKG button. 2. But there is underlying chronic lung disease with progression since 2015 including lower lobe Bronchiectasis, Emphysema (ICD10-J43.9), increasing architectural distortion and honeycombing. 3. Aortic Atherosclerosis (ICD10-I70.0). Calcified coronary artery atherosclerosis. Electronically Signed   By: Odessa Fleming M.D.   On: 02/26/2020 12:21   CT Cervical Spine Wo Contrast  Result Date: 02/25/2020 CLINICAL DATA:  69 year old male with head trauma. EXAM: CT CERVICAL SPINE WITHOUT CONTRAST TECHNIQUE: Multidetector CT imaging of the cervical spine was performed without intravenous contrast. Multiplanar CT image reconstructions were also generated. COMPARISON:  None. FINDINGS: Alignment: No acute subluxation. Skull base and vertebrae: No acute fracture. Osteopenia. Soft tissues and spinal canal: No prevertebral fluid or swelling. No visible canal hematoma. Disc levels:  Multilevel degenerative changes with endplate irregularity and disc space narrowing. Upper chest: Emphysema. Other: Bilateral carotid bulb calcified plaques. IMPRESSION: 1. No acute/traumatic cervical spine pathology. 2. Multilevel degenerative changes. Electronically Signed   By: Elgie Collard M.D.   On: 02/25/2020 18:57   MR BRAIN WO CONTRAST  Result Date: 02/26/2020 CLINICAL DATA:  Syncope EXAM: MRI HEAD WITHOUT CONTRAST TECHNIQUE: Multiplanar, multiecho pulse sequences of the brain and surrounding structures were obtained without intravenous contrast. COMPARISON:  None. FINDINGS: Brain: Small amount of acute subdural hemorrhage along the anterior falx cerebri. Small amount of adjacent subarachnoid hemorrhage also unchanged. Multifocal hyperintense T2-weighted signal within the white matter. Normal volume of CSF spaces. No chronic microhemorrhage. Normal midline structures. Vascular: Normal flow voids. Skull and upper cervical spine: Normal marrow signal. Sinuses/Orbits: Negative. Other:  None. IMPRESSION: 1. Small amount of acute subdural hemorrhage along the anterior falx cerebri. Small amount of adjacent subarachnoid hemorrhage also unchanged. 2. Findings of chronic microvascular ischemia. Electronically Signed   By: Deatra Robinson M.D.   On: 02/26/2020 01:02   US Carotid Bilateral  Result Date: 02/26/2020 CLINICAL DATA:  69 year old with syncope. EXAM: BILATERAL CAROTID DUPLEX ULTRASOUND TECHNIQUE: Wallace Cullens scale imaging, color Doppler and duplex ultrasound were performed of bilateral carotid and vertebral arteries in the neck. COMPARISON:  Brain MRI 02/26/2020 FINDINGS: Criteria: Quantification of carotid stenosis is based on velocity parameters that correlate the residual internal carotid diameter with NASCET-based stenosis levels, using the diameter of the distal internal carotid lumen as the denominator for stenosis measurement. The following velocity measurements were obtained: RIGHT ICA: 97/32  cm/sec CCA: 87/18 cm/sec SYSTOLIC ICA/CCA RATIO:  1.1 ECA: 149 cm/sec LEFT ICA: 192/43 cm/sec CCA: 95/21 cm/sec SYSTOLIC ICA/CCA RATIO:  2.0 ECA: 147 cm/sec RIGHT CAROTID ARTERY: Echogenic plaque with shadowing at the right carotid bulb. External carotid artery is patent with normal waveform. Extensive echogenic plaque with shadowing in the internal carotid artery. Normal waveforms and velocities in the internal carotid artery. RIGHT VERTEBRAL ARTERY: Antegrade flow and normal waveform in the right vertebral artery. LEFT CAROTID ARTERY: Scattered plaque in the left common carotid artery. Echogenic plaque with extensive posterior acoustic shadowing at the left carotid bulb. External carotid artery is patent with normal waveform. Extensive echogenic plaque with posterior acoustic shadowing in the proximal internal carotid artery. Limited visualization of the internal carotid artery due to the posterior acoustic shadowing. Peak systolic velocity in the left internal carotid artery is elevated measuring up to 192 cm/sec. Distal left internal carotid artery is patent. LEFT VERTEBRAL ARTERY: Antegrade flow and normal waveform in the left vertebral artery. IMPRESSION: 1. Atherosclerotic plaque involving bilateral carotid arteries. Portions of the carotid arteries are not visualized due to posterior acoustic shadowing from the echogenic plaque. 2. Estimated degree of stenosis in left internal carotid artery is 50-69%. 3. Estimated degree of stenosis in right internal carotid artery is less than 50%. 4. Patent vertebral arteries with antegrade flow. Electronically Signed   By: Richarda Overlie M.D.   On: 02/26/2020 08:02   CT Maxillofacial W Contrast  Result Date: 02/25/2020 CLINICAL DATA:  Left facial pain EXAM: CT MAXILLOFACIAL WITH CONTRAST TECHNIQUE: Multidetector CT imaging of the maxillofacial structures was performed with intravenous contrast. Multiplanar CT image reconstructions were also generated. CONTRAST:  7mL  OMNIPAQUE IOHEXOL 300 MG/ML  SOLN COMPARISON:  None. FINDINGS: Osseous: No fracture or mandibular dislocation. No destructive process. Orbits: Negative. No traumatic or inflammatory finding. Sinuses: Clear. Soft tissues: There is mild thickening of the platysma and subcutaneous fat stranding of the lower left face, near the tail of the parotid gland. No sialolithiasis. No obvious source of infection. Limited intracranial: Normal IMPRESSION: Mild thickening of the platysma and subcutaneous fat stranding of the lower left face, near the tail of the parotid gland, likely indicating cellulitis. No sialolithiasis or obvious source of infection. Electronically Signed   By: Deatra Robinson M.D.   On: 02/25/2020 20:56   ECHOCARDIOGRAM COMPLETE  Result Date: 02/27/2020    ECHOCARDIOGRAM REPORT   Patient Name:   Brian Orr Date of Exam: 02/26/2020 Medical Rec #:  [id number]     Height:       69.0 in Accession #:    [id number]    Weight:       211.0 lb Date of Birth:  [birth year]  BSA:          2.113 m Patient Age:    69 years      BP:           140/67 mmHg Patient Gender: M             HR:           81 bpm. Exam Location:  [facility] Procedure: 2D Echo, Color Doppler and Cardiac Doppler Indications:     R55 Syncope  History:         Patient has no prior history of Echocardiogram examinations.                  Risk Factors:Hypertension and Dyslipidemia.  Sonographer:     Humphrey Rolls RDCS (AE) Referring Phys:  [id number] Gertha Calkin Diagnosing Phys: Yvonne Kendall MD  Sonographer Comments: No subcostal window. IMPRESSIONS  1. Left ventricular ejection fraction, by estimation, is 60 to 65%. The left ventricle has normal function. The left ventricle has no regional wall motion abnormalities. Left ventricular diastolic parameters were normal.  2. Right ventricular systolic function is normal. The right ventricular size is normal. Tricuspid regurgitation signal is inadequate for assessing PA pressure.  3. Right atrial size was mildly  dilated.  4. The mitral valve is normal in structure. Trivial mitral valve regurgitation. No evidence of mitral stenosis.  5. The aortic valve is tricuspid. There is mild calcification of the aortic valve. There is moderate thickening of the aortic valve. Aortic valve regurgitation is not visualized. Mild to moderate aortic valve sclerosis/calcification is present, without any evidence of aortic stenosis.  6. Calcified atherosclerotic plaque is noted near the sinotubular junction of the aorta. FINDINGS  Left Ventricle: Left ventricular ejection fraction, by estimation, is 60 to 65%. The left ventricle has normal function. The left ventricle has no regional wall motion abnormalities. The left ventricular internal cavity size was normal in size. There is  no left ventricular hypertrophy. Left ventricular diastolic parameters were normal. Right Ventricle: The right ventricular size is normal. No increase in right ventricular wall thickness. Right ventricular systolic function is normal. Tricuspid regurgitation signal is inadequate for assessing PA pressure. Left Atrium: Left atrial size was normal in size. Right Atrium: Right atrial size was mildly dilated. Pericardium: The pericardium was not well visualized. Presence of pericardial fat pad. Mitral Valve: The mitral valve is normal in structure. Trivial mitral valve regurgitation. No evidence of mitral valve stenosis. MV peak gradient, 3.6 mmHg. The mean mitral valve gradient is 2.0 mmHg. Tricuspid Valve: The tricuspid valve is not well visualized. Tricuspid valve regurgitation is not demonstrated. Aortic Valve: The aortic valve is tricuspid. There is mild calcification of the aortic valve. There is moderate thickening of the aortic valve. Aortic valve regurgitation is not visualized. Mild to moderate aortic valve sclerosis/calcification is present, without any evidence of aortic stenosis. Aortic valve mean gradient measures 4.0 mmHg. Aortic valve peak gradient  measures 8.9 mmHg. Aortic valve area, by VTI measures 2.83 cm. Pulmonic Valve: The pulmonic valve was normal in structure. Pulmonic valve regurgitation is not visualized. No evidence of pulmonic stenosis. Aorta: Calcified atherosclerotic plaque is noted near the sinotubular junction of the aorta. The aortic root is normal in size and structure. Pulmonary Artery: The pulmonary artery is of normal size. Venous: The inferior vena cava was not well visualized. IAS/Shunts: The interatrial septum was not well visualized.  LEFT VENTRICLE PLAX 2D LVIDd:         3.86 cm  Diastology LVIDs:         2.59 cm  LV e' medial:    8.27 cm/s LV PW:         1.00 cm  LV E/e' medial:  8.5 LV IVS:        0.92 cm  LV e' lateral:   10.10 cm/s LVOT diam:     2.30 cm  LV E/e' lateral: 7.0 LV SV:         76 LV SV Index:   36 LVOT Area:     4.15 cm  RIGHT VENTRICLE RV Basal diam:  3.71 cm TAPSE (M-mode): 3.0 cm LEFT ATRIUM             Index       RIGHT ATRIUM           Index LA diam:        4.50 cm 2.13 cm/m  RA Area:     18.70 cm LA Vol (A2C):   32.2 ml 15.24 ml/m RA Volume:   53.00 ml  25.08 ml/m LA Vol (A4C):   33.8 ml 15.99 ml/m LA Biplane Vol: 35.5 ml 16.80 ml/m  AORTIC VALVE                   PULMONIC VALVE AV Area (Vmax):    2.62 cm    PV Vmax:       1.32 m/s AV Area (Vmean):   2.56 cm    PV Vmean:      88.600 cm/s AV Area (VTI):     2.83 cm    PV VTI:        0.222 m AV Vmax:           149.00 cm/s PV Peak grad:  7.0 mmHg AV Vmean:          99.800 cm/s PV Mean grad:  3.0 mmHg AV VTI:            0.270 m AV Peak Grad:      8.9 mmHg AV Mean Grad:      4.0 mmHg LVOT Vmax:         94.00 cm/s LVOT Vmean:        61.400 cm/s LVOT VTI:          0.184 m LVOT/AV VTI ratio: 0.68  AORTA Ao Root diam: 3.50 cm MITRAL VALVE MV Area (PHT): 7.37 cm    SHUNTS MV Peak grad:  3.6 mmHg    Systemic VTI:  0.18 m MV Mean grad:  2.0 mmHg    Systemic Diam: 2.30 cm MV Vmax:       0.95 m/s MV Vmean:      66.8 cm/s MV Decel Time: 103 msec MV E velocity:  70.70 cm/s MV A velocity: 76.30 cm/s MV E/A ratio:  0.93 Cristal De[place] MD Electronically signed by Yvonne Kendall MD Signature Date/Time: 02/27/2020/6:59:02 AM    Final     Microbiology: Recent Results (from the past 240 hour(s))  Respiratory Panel by RT PCR (Flu A&B, Covid) - Nasopharyngeal Swab     Status: None   Collection Time: 02/25/20  8:47 PM   Specimen: Nasopharyngeal Swab  Result Value Ref Range Status   SARS Coronavirus 2 by RT PCR NEGATIVE NEGATIVE Final    Comment: (NOTE) SARS-CoV-2 target nucleic acids are NOT DETECTED.  The SARS-CoV-2 RNA is generally detectable in upper respiratoy specimens during the acute phase of infection. The lowest concentration of SARS-CoV-2 viral copies this assay can  detect is 131 copies/mL. A negative result does not preclude SARS-Cov-2 infection and should not be used as the sole basis for treatment or other patient management decisions. A negative result may occur with  improper specimen collection/handling, submission of specimen other than nasopharyngeal swab, presence of viral mutation(s) within the areas targeted by this assay, and inadequate number of viral copies (<131 copies/mL). A negative result must be combined with clinical observations, patient history, and epidemiological information. The expected result is Negative.  Fact Sheet for Patients:  https://www.moore.com/  Fact Sheet for Healthcare Providers:  https://www.young.biz/  This test is no t yet approved or cleared by the [place] FDA and  has been authorized for detection and/or diagnosis of SARS-CoV-2 by FDA under an Emergency Use Authorization (EUA). This EUA will remain  in effect (meaning this test can be used) for the duration of the COVID-19 declaration under Section 564(b)(1) of the Act, 21 U.S.C. section 360bbb-3(b)(1), unless the authorization is terminated or revoked sooner.     Influenza A by PCR NEGATIVE  NEGATIVE Final   Influenza B by PCR NEGATIVE NEGATIVE Final    Comment: (NOTE) The Xpert Xpress SARS-CoV-2/FLU/RSV assay is intended as an aid in  the diagnosis of influenza from Nasopharyngeal swab specimens and  should not be used as a sole basis for treatment. Nasal washings and  aspirates are unacceptable for Xpert Xpress SARS-CoV-2/FLU/RSV  testing.  Fact Sheet for Patients: https://www.moore.com/  Fact Sheet for Healthcare Providers: https://www.young.biz/  This test is not yet approved or cleared by the [place] FDA and  has been authorized for detection and/or diagnosis of SARS-CoV-2 by  FDA under an Emergency Use Authorization (EUA). This EUA will remain  in effect (meaning this test can be used) for the duration of the  Covid-19 declaration under Section 564(b)(1) of the Act, 21  U.S.C. section 360bbb-3(b)(1), unless the authorization is  terminated or revoked. Performed at [facility], [address] [place], [place] [address]      Labs: Basic Metabolic Panel: Recent Labs  Lab 02/25/20 1752 02/26/20 0652 02/27/20 0437  NA 139 139 137  K 3.4* 4.0 4.7  CL 99 101 101  CO2 27 29 29   GLUCOSE 121* 99 143*  BUN 14 13 15   CREATININE 1.07 1.06 0.87  CALCIUM 9.3 9.3 8.9   Liver Function Tests: No results for input(s): AST, ALT, ALKPHOS, BILITOT, PROT, ALBUMIN in the last 168 hours. No results for input(s): LIPASE, AMYLASE in the last 168 hours. No results for input(s): AMMONIA in the last 168 hours. CBC: Recent Labs  Lab 02/25/20 1752 02/26/20 0652 02/27/20 0437  WBC 9.5 7.9 7.8  NEUTROABS  --  5.3 6.9  HGB 14.0 13.6 13.8  HCT 39.9 38.5* 38.0*  MCV 92.1 91.9 90.9  PLT 296 283 294   Cardiac Enzymes: No results for input(s): CKTOTAL, CKMB, CKMBINDEX, TROPONINI in the last 168 hours. BNP: BNP (last 3 results) No results for input(s): BNP in the last 8760 hours.  ProBNP (last 3 results) No results  for input(s): PROBNP in the last 8760 hours.  CBG: No results for input(s): GLUCAP in the last 168 hours.     Signed:  02/28/20, MD [facility] 02/27/2020, 10:16 AM

## 2020-02-28 ENCOUNTER — Other Ambulatory Visit: Payer: Self-pay | Admitting: Neurosurgery

## 2020-02-28 DIAGNOSIS — S065XAA Traumatic subdural hemorrhage with loss of consciousness status unknown, initial encounter: Secondary | ICD-10-CM

## 2020-02-28 DIAGNOSIS — S065X9A Traumatic subdural hemorrhage with loss of consciousness of unspecified duration, initial encounter: Secondary | ICD-10-CM

## 2020-03-13 ENCOUNTER — Other Ambulatory Visit: Payer: Self-pay

## 2020-03-13 ENCOUNTER — Ambulatory Visit
Admission: RE | Admit: 2020-03-13 | Discharge: 2020-03-13 | Disposition: A | Discharge status: Home / Self Care | Payer: Medicare HMO | Source: Ambulatory Visit | Attending: Neurosurgery | Admitting: Neurosurgery

## 2020-03-13 DIAGNOSIS — S065X9A Traumatic subdural hemorrhage with loss of consciousness of unspecified duration, initial encounter: Secondary | ICD-10-CM | POA: Insufficient documentation

## 2020-03-13 DIAGNOSIS — S065XAA Traumatic subdural hemorrhage with loss of consciousness status unknown, initial encounter: Secondary | ICD-10-CM

## 2021-02-12 ENCOUNTER — Ambulatory Visit: Payer: Medicare HMO | Admitting: Podiatry

## 2021-02-17 ENCOUNTER — Ambulatory Visit (INDEPENDENT_AMBULATORY_CARE_PROVIDER_SITE_OTHER): Payer: Medicare HMO

## 2021-02-17 ENCOUNTER — Other Ambulatory Visit: Payer: Self-pay

## 2021-02-17 ENCOUNTER — Ambulatory Visit: Payer: Medicare HMO | Admitting: Podiatry

## 2021-02-17 ENCOUNTER — Encounter: Payer: Self-pay | Admitting: Podiatry

## 2021-02-17 DIAGNOSIS — J8410 Pulmonary fibrosis, unspecified: Secondary | ICD-10-CM | POA: Insufficient documentation

## 2021-02-17 DIAGNOSIS — G5793 Unspecified mononeuropathy of bilateral lower limbs: Secondary | ICD-10-CM

## 2021-02-17 DIAGNOSIS — Z87891 Personal history of nicotine dependence: Secondary | ICD-10-CM | POA: Insufficient documentation

## 2021-02-17 DIAGNOSIS — M778 Other enthesopathies, not elsewhere classified: Secondary | ICD-10-CM | POA: Diagnosis not present

## 2021-02-17 DIAGNOSIS — J309 Allergic rhinitis, unspecified: Secondary | ICD-10-CM | POA: Insufficient documentation

## 2021-02-17 DIAGNOSIS — R7303 Prediabetes: Secondary | ICD-10-CM | POA: Insufficient documentation

## 2021-02-17 DIAGNOSIS — N200 Calculus of kidney: Secondary | ICD-10-CM | POA: Insufficient documentation

## 2021-02-17 MED ORDER — MELOXICAM 7.5 MG PO TABS: 7.5000 mg | ORAL_TABLET | Freq: Every day | ORAL | 0 refills | Status: DC

## 2021-02-17 NOTE — Progress Notes (Signed)
Subjective:  Patient ID: Brian Orr, male    DOB: [birth year],  MRN: [id number] HPI Chief Complaint  Patient presents with   Foot Pain    Plantar forefoot/arch bilateral - aching, numbness, burning x 3 years, seen a few docs-said fasciitis, neuropathy, symptoms worse at night, knots in arch, had injections and uses a "neuro" cream   New Patient (Initial Visit)    70 y.o. male presents with the above complaint.   ROS: Denies fever chills nausea vomiting muscle aches pains calf pain back pain chest pain shortness of breath.  Past Medical History:  Diagnosis Date   Hyperlipidemia    Hypertension    Pulmonary fibrosis (HCC)    No past surgical history on file.  Current Outpatient Medications:    CHROMIUM PO, Take by mouth., Disp: , Rfl:    CINNAMON PO, Take by mouth., Disp: , Rfl:    Cyanocobalamin (B-12 PO), Take by mouth., Disp: , Rfl:    meloxicam (MOBIC) 7.5 MG tablet, Take 1 tablet (7.5 mg total) by mouth daily., Disp: 30 tablet, Rfl: 0   TURMERIC PO, Take by mouth., Disp: , Rfl:    VITAMIN A PO, Take by mouth., Disp: , Rfl:    atorvastatin (LIPITOR) 40 MG tablet, Take 40 mg by mouth daily., Disp: , Rfl:    Cetirizine HCl 10 MG CAPS, Take 10 mg by mouth daily., Disp: , Rfl:    co-enzyme Q-10 50 MG capsule, Take 100 mg by mouth daily., Disp: , Rfl:    cyanocobalamin 1000 MCG tablet, Take 2,500 mcg by mouth daily., Disp: , Rfl:    docusate sodium (COLACE) 100 MG capsule, Take 100 mg by mouth daily as needed for mild constipation., Disp: , Rfl:    famotidine (PEPCID) 20 MG tablet, Take 20 mg by mouth in the morning and at bedtime., Disp: , Rfl:    hydrocortisone cream 1 %, Apply 1 application topically daily as needed. Face, Disp: , Rfl:    INCRUSE ELLIPTA 62.5 MCG/INH AEPB, Inhale 1 puff into the lungs daily., Disp: , Rfl:    magnesium oxide (MAG-OX) 400 MG tablet, Take 400 mg by mouth daily., Disp: , Rfl:    Multiple Vitamin (MULTI-VITAMIN) tablet, Take 1 tablet by mouth  daily., Disp: , Rfl:    niacin 500 MG tablet, Take 500 mg by mouth daily., Disp: , Rfl:    Omega-3 Fatty Acids (FISH OIL) 1200 MG CAPS, Take 1,200 mg by mouth daily., Disp: , Rfl:    pseudoephedrine (SUDAFED) 30 MG tablet, Take 30 mg by mouth every 4 (four) hours as needed., Disp: , Rfl:    pyridoxine (B-6) 100 MG tablet, Take 100 mg by mouth daily., Disp: , Rfl:    Saw Palmetto 500 MG CAPS, Take 900 mg by mouth daily., Disp: , Rfl:    sildenafil (VIAGRA) 50 MG tablet, Take 50 mg by mouth daily as needed., Disp: , Rfl:    tamsulosin (FLOMAX) 0.4 MG CAPS capsule, Take 0.4 mg by mouth daily., Disp: , Rfl:    triamcinolone cream (KENALOG) 0.1 %, Apply topically., Disp: , Rfl:    Zinc Acetate 50 MG CAPS, Take 50 mg by mouth daily., Disp: , Rfl:   Allergies  Allergen Reactions   Wasp Venom Anaphylaxis   Other     Other reaction(s): Unknown POISON IVY   Poison Ivy Extract    Wasp Venom Protein     Other reaction(s): Other   Review of Systems Objective:  There were no  vitals filed for this visit.  General: Well developed, nourished, in no acute distress, alert and oriented x3   Dermatological: Skin is warm, dry and supple bilateral. Nails x 10 are well maintained; remaining integument appears unremarkable at this time. There are no open sores, no preulcerative lesions, no rash or signs of infection present.  Vascular: Dorsalis Pedis artery and Posterior Tibial artery pedal pulses are 2/4 bilateral with immedate capillary fill time. Pedal hair growth present. No varicosities and no lower extremity edema present bilateral.   Neruologic: Grossly intact via light touch bilateral. Vibratory intact via tuning fork bilateral. Protective threshold with Semmes Wienstein monofilament intact to all pedal sites bilateral. Patellar and Achilles deep tendon reflexes 2+ bilateral. No Babinski or clonus noted bilateral.   Musculoskeletal: No gross boney pedal deformities bilateral. No pain, crepitus, or  limitation noted with foot and ankle range of motion bilateral. Muscular strength 5/5 in all groups tested bilateral.  Pain on palpation and end range of motion of the second metatarsophalangeal joint without a lot of edema or inflammation beneath the second metatarsal phalangeal joints.  Pain on palpation of the joints noted.  Gait: Unassisted, Nonantalgic.    Radiographs:  Radiographs taken today demonstrate an osseously mature individual plantarflexed elongated second metatarsals no acute findings noted.  Assessment & Plan:   Assessment: Capsulitis of the second metatarsophalangeal joint bilaterally.  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  Plan: Injected the area today around the joint second metatarsal phalangeal joint.  Started him on 7.5 mg of meloxicam.  Also discussed the use of Voltaren gel.  Like to follow-up with him in 1 month and see if he is doing better.  I would like to consider gabapentin at that time if he has not improved with his nighttime pain.  Also discussed etiology pathology conservative surgical therapies regarding the use of different shoe gear.     Coulter Oldaker T. [place], [place]

## 2021-02-18 DIAGNOSIS — K2100 Gastro-esophageal reflux disease with esophagitis, without bleeding: Secondary | ICD-10-CM | POA: Insufficient documentation

## 2021-02-18 DIAGNOSIS — I700 Atherosclerosis of aorta: Secondary | ICD-10-CM | POA: Insufficient documentation

## 2021-02-18 DIAGNOSIS — N401 Enlarged prostate with lower urinary tract symptoms: Secondary | ICD-10-CM | POA: Insufficient documentation

## 2021-03-13 ENCOUNTER — Other Ambulatory Visit: Payer: Self-pay | Admitting: Podiatry

## 2021-04-23 ENCOUNTER — Ambulatory Visit (INDEPENDENT_AMBULATORY_CARE_PROVIDER_SITE_OTHER): Payer: Medicare HMO | Admitting: Podiatry

## 2021-04-23 DIAGNOSIS — M778 Other enthesopathies, not elsewhere classified: Secondary | ICD-10-CM

## 2021-04-23 MED ORDER — MELOXICAM 7.5 MG PO TABS: 7.5000 mg | ORAL_TABLET | Freq: Every day | ORAL | 3 refills | Status: DC

## 2021-04-23 NOTE — Progress Notes (Signed)
error 

## 2021-04-30 ENCOUNTER — Encounter: Payer: Self-pay | Admitting: Podiatry

## 2021-04-30 ENCOUNTER — Ambulatory Visit: Payer: Medicare HMO | Admitting: Podiatry

## 2021-04-30 ENCOUNTER — Other Ambulatory Visit: Payer: Self-pay

## 2021-04-30 DIAGNOSIS — M778 Other enthesopathies, not elsewhere classified: Secondary | ICD-10-CM | POA: Diagnosis not present

## 2021-04-30 DIAGNOSIS — Z87442 Personal history of urinary calculi: Secondary | ICD-10-CM | POA: Insufficient documentation

## 2021-04-30 MED ORDER — MELOXICAM 7.5 MG PO TABS: 7.5000 mg | ORAL_TABLET | Freq: Every day | ORAL | 3 refills | Status: AC

## 2021-04-30 NOTE — Progress Notes (Signed)
He presents today states that his feet are feeling much better they are not hurting every day like they were he states that he is very happy with the outcome and that he does not mind taking the 7.5 mg of meloxicam daily.  States that he just saw his primary care doctor and she was very satisfied with his blood work but would like his blood pressure to come down slightly.  So she started him on lisinopril.  Objective: Vital signs are stable alert and oriented x3.  Pulses are palpable.  No reproducible pain on palpation.  Forefoot capsulitis has decreased significantly there is no thickness of the pad.  No pain on range of motion of the toes.  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  He has some trigger point pain in this area but only on direct palpation.  He states that it really does not bother him with any ambulation or standing.  Assessment: Well-healing capsulitis forefoot.  Plan: Follow-up with [place] on an as-needed basis continue the use of the meloxicam as long as he has not had developing any symptomatology GI issues or an increase in his hypertension.

## 2021-05-05 ENCOUNTER — Telehealth: Payer: Self-pay | Admitting: *Deleted

## 2021-05-05 NOTE — Telephone Encounter (Signed)
"  Dr. Al Corpus was supposed to change my Meloxicam prescription to a 90 day supply.  Has it been called in yet?"

## 2022-02-10 ENCOUNTER — Other Ambulatory Visit: Payer: Self-pay | Admitting: Specialist

## 2022-02-10 DIAGNOSIS — J849 Interstitial pulmonary disease, unspecified: Secondary | ICD-10-CM

## 2022-02-12 ENCOUNTER — Ambulatory Visit
Admission: RE | Admit: 2022-02-12 | Discharge: 2022-02-12 | Disposition: A | Discharge status: Home / Self Care | Payer: Medicare HMO | Source: Ambulatory Visit | Attending: Specialist | Admitting: Specialist

## 2022-02-12 ENCOUNTER — Other Ambulatory Visit: Payer: Self-pay | Admitting: Specialist

## 2022-02-12 DIAGNOSIS — J849 Interstitial pulmonary disease, unspecified: Secondary | ICD-10-CM | POA: Insufficient documentation

## 2022-03-26 IMAGING — CR DG CHEST 2V
2 series · 2 of 2 positions shown · non-contrast
Comparison: Chest radiograph dated 07/04/2017

CLINICAL DATA: 69-year-old male with fall.

EXAM:
CHEST - 2 VIEW

[chest pa]
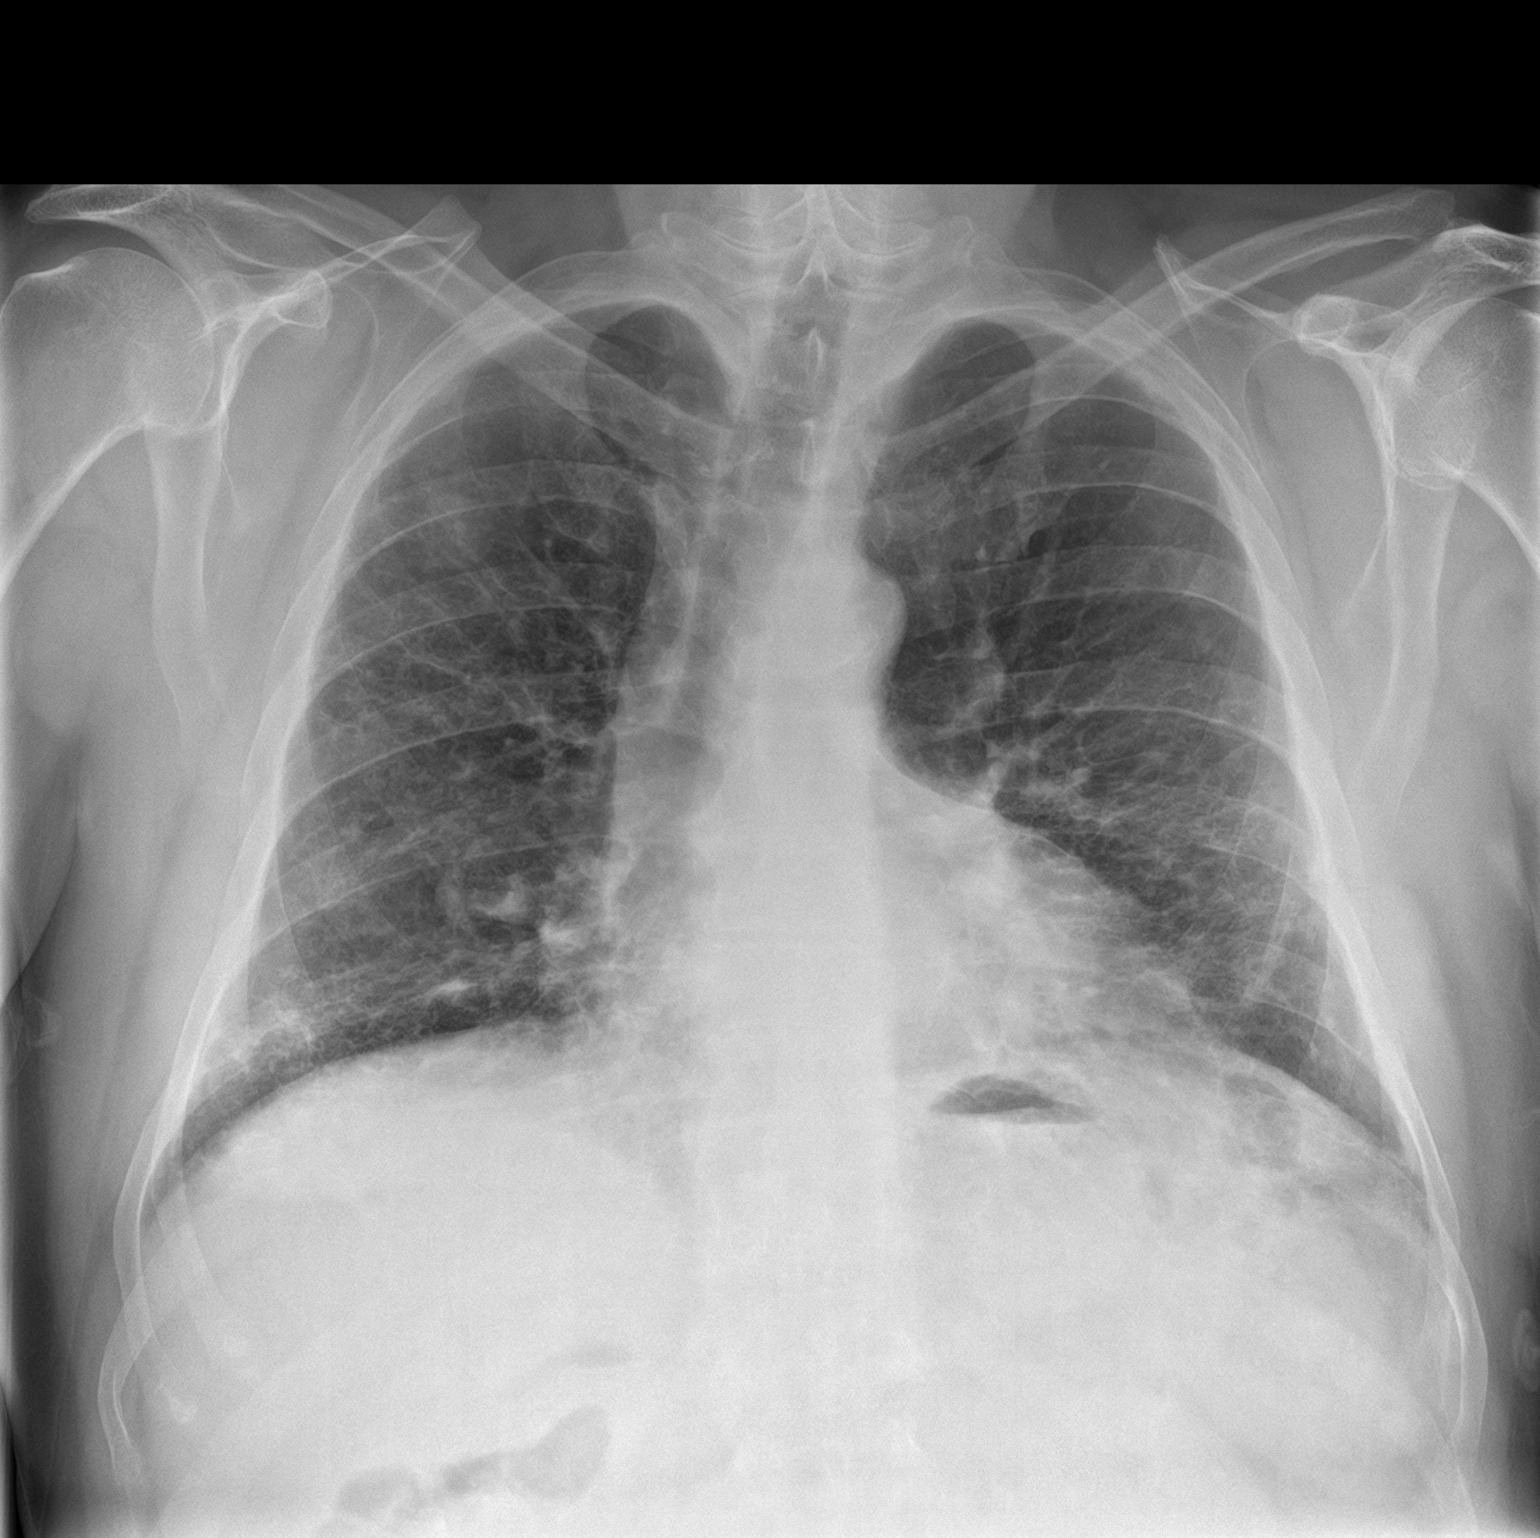

[chest lat]
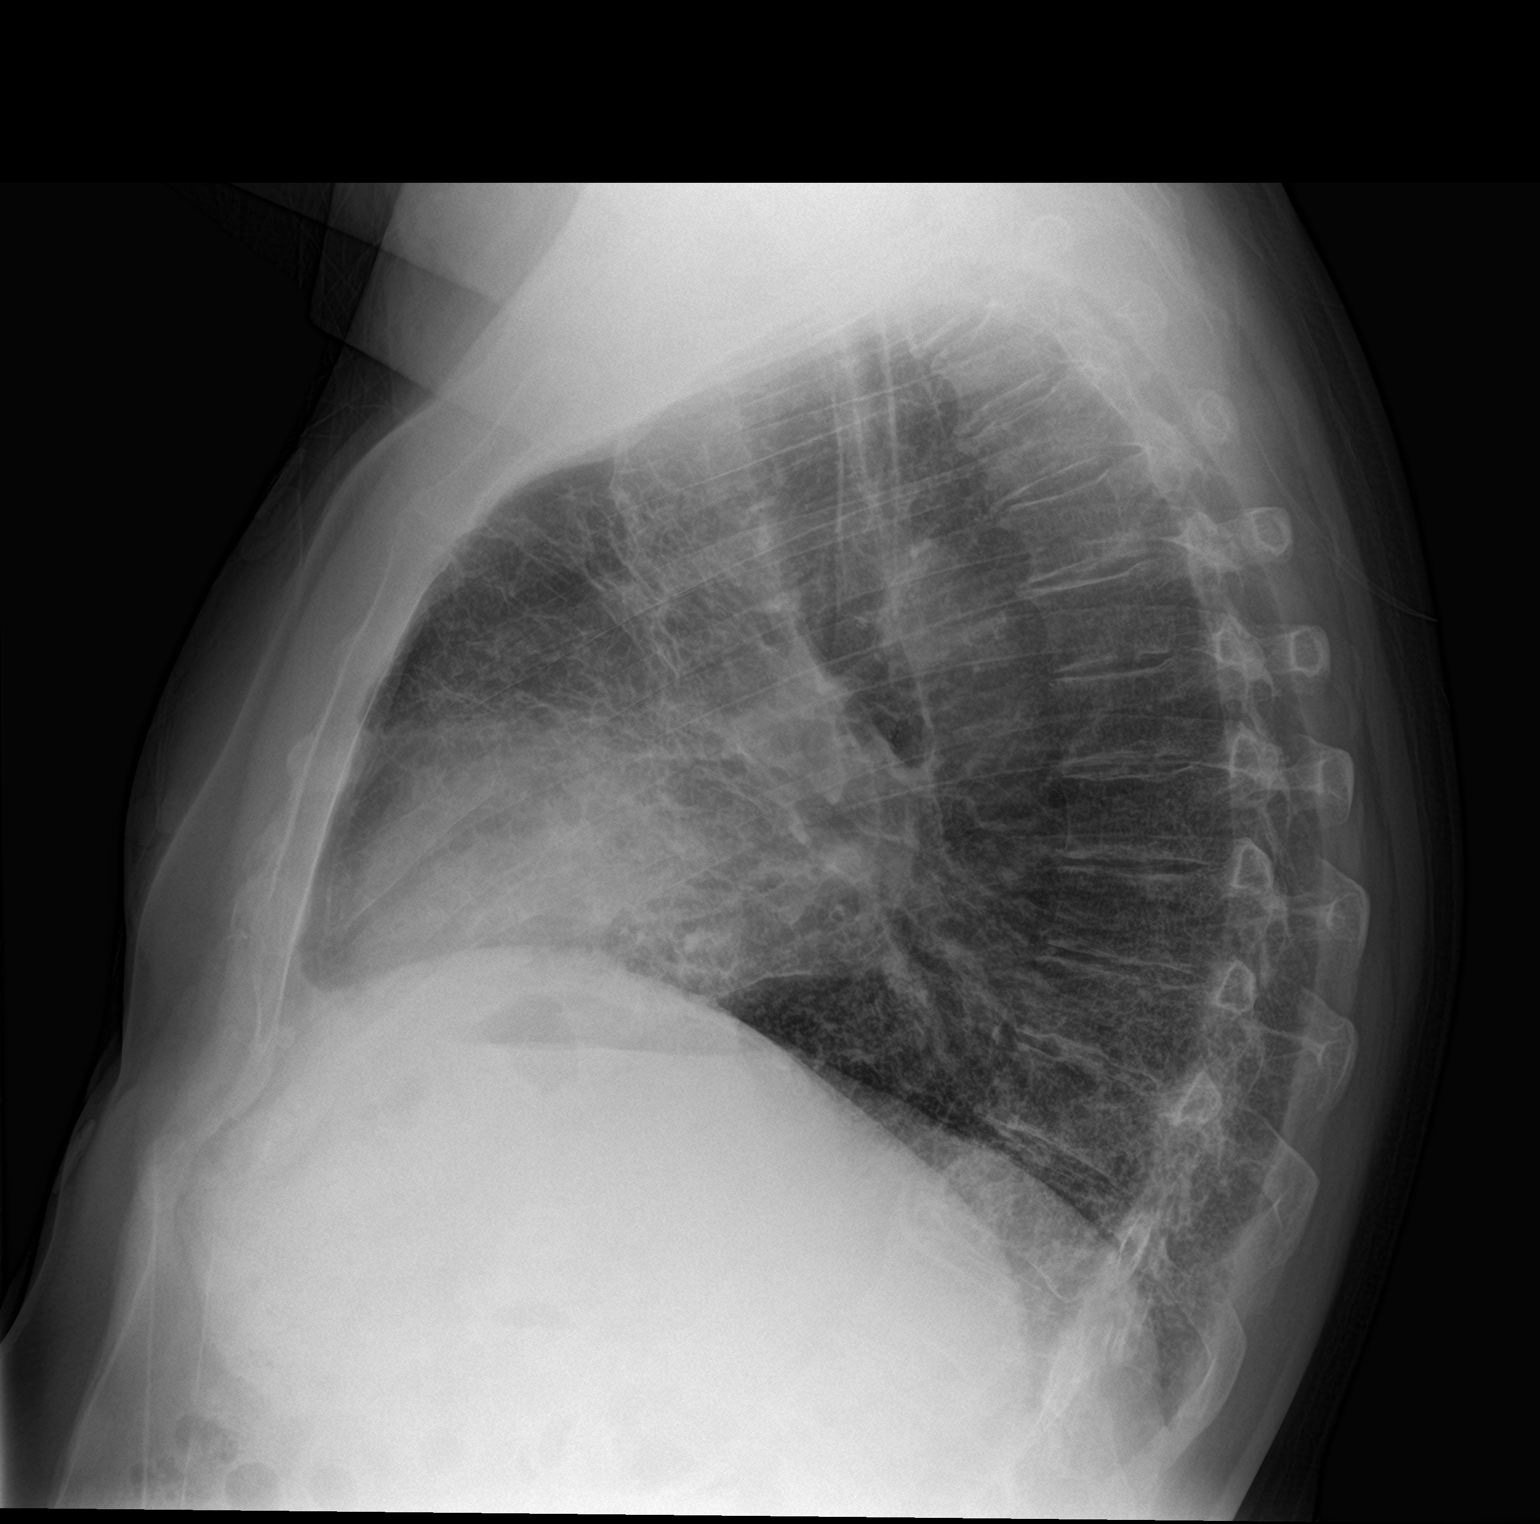

[2 of 2 positions shown; findings below may reference images not displayed]

FINDINGS: Emphysema with chronic interstitial coarsening. There is a 2 cm
right upper lobe nodule, new since the prior radiograph. Further
evaluation with chest CT is recommended. No consolidative changes.
There is no pleural effusion pneumothorax. The cardiac silhouette is
within limits. No acute osseous pathology. Degenerative changes of
the spine.
IMPRESSION: 1. No acute cardiopulmonary process.  Emphysema.
2. A 2 cm right upper lobe nodule. Further evaluation with chest CT
is recommended.

## 2022-03-26 IMAGING — CT CT CERVICAL SPINE W/O CM
4 series · 15 of 33 positions shown, 18 images · non-contrast
Comparison: None.

CLINICAL DATA: 69-year-old male with head trauma.

EXAM:
CT CERVICAL SPINE WITHOUT CONTRAST
TECHNIQUE: Multidetector CT imaging of the cervical spine was performed without
intravenous contrast. Multiplanar CT image reconstructions were also
generated.

[Series 3: c spine soft · axial · 0.36mm/px · z∈[-296,-264]mm · 2 of 96 slices shown]
[im 16/96  soft-tissue]
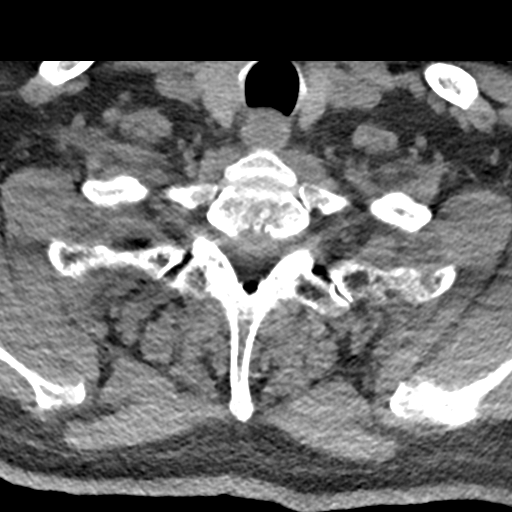
[im 32/96  soft-tissue]
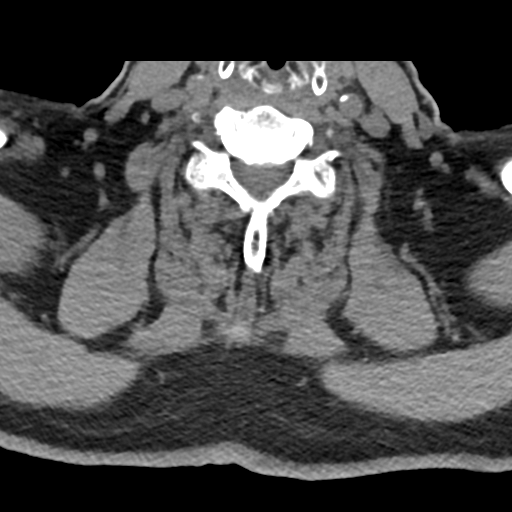

[Series 4: sagittal bone · sagittal · 0.33mm/px · 5 of 85 slices shown, 6 images]
[im 29/85  bone]
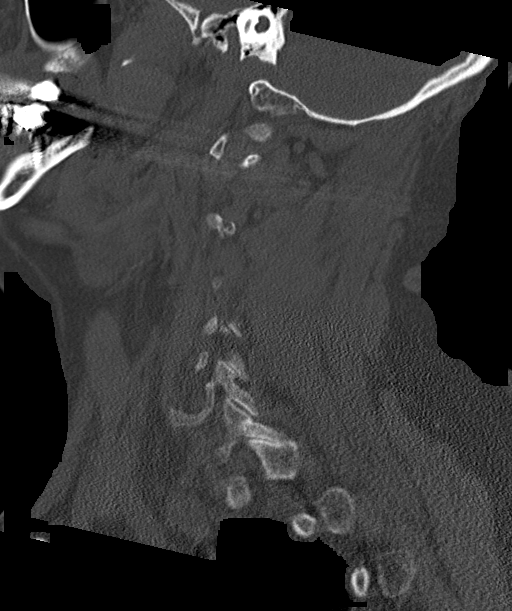
[im 36/85  bone]
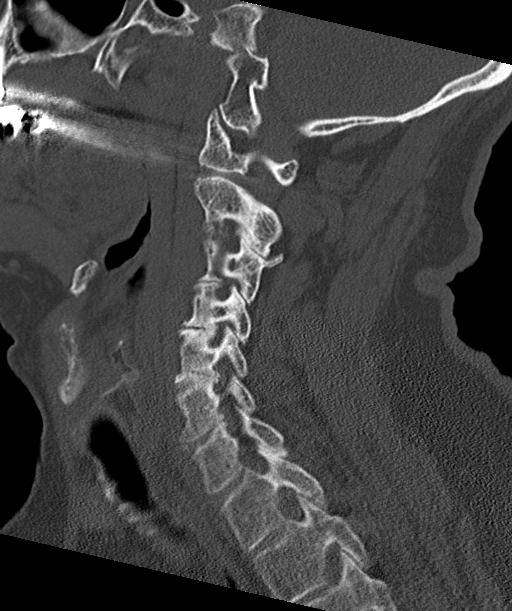
[im 43/85  soft-tissue]
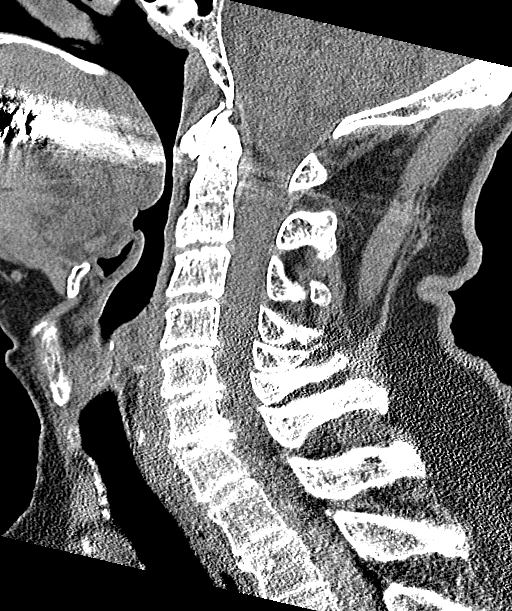
[im 43/85  bone]
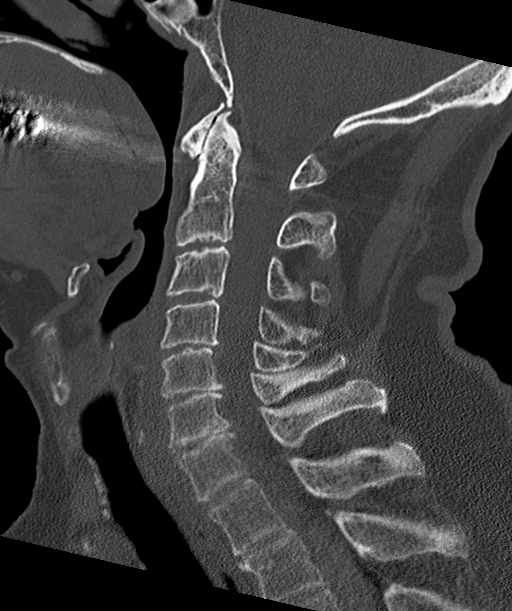
[im 50/85  bone]
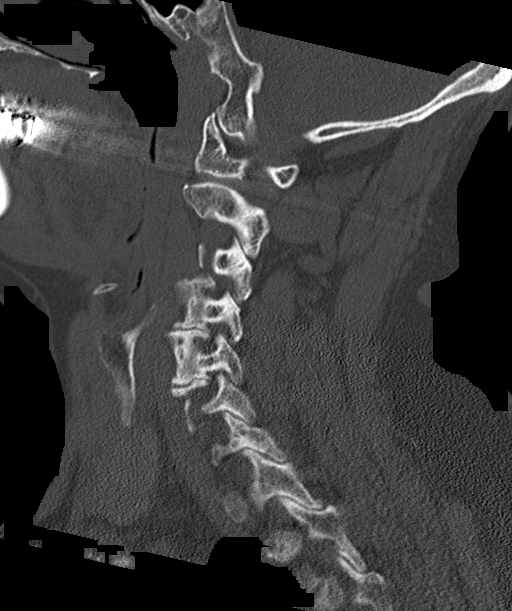
[im 57/85  bone]
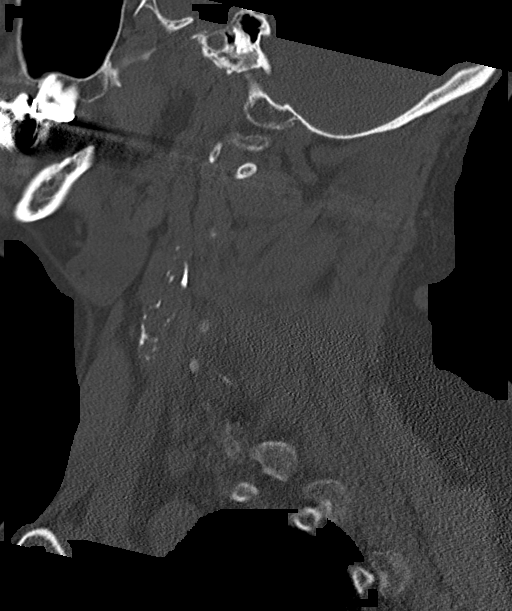

[Series 5: coronal bone · coronal · 0.33mm/px · 3 of 86 slices shown]
[im 18/86  bone]
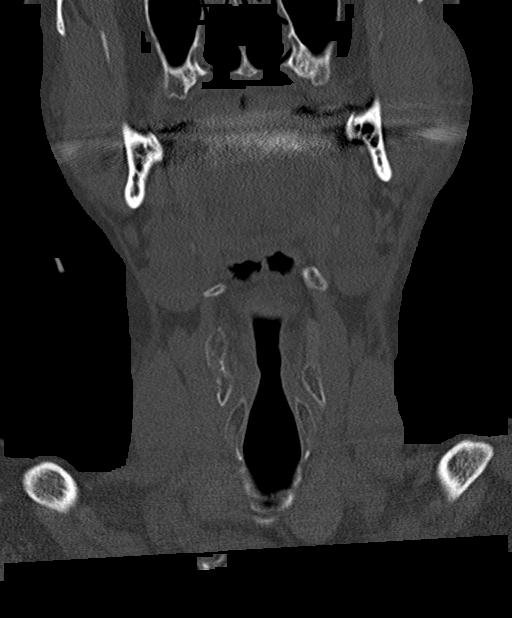
[im 35/86  bone]
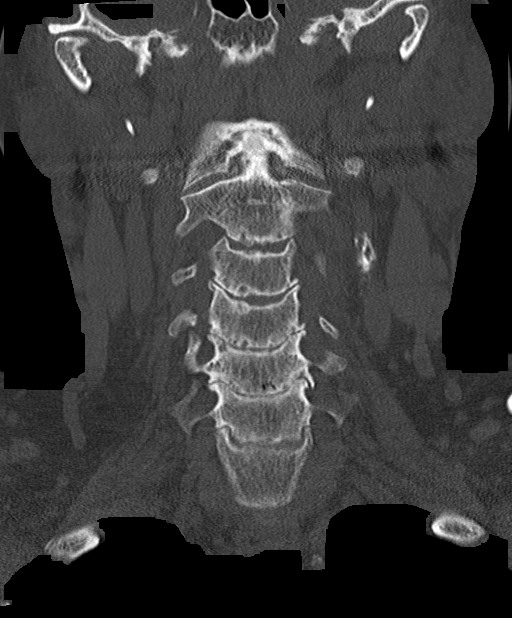
[im 52/86  bone]
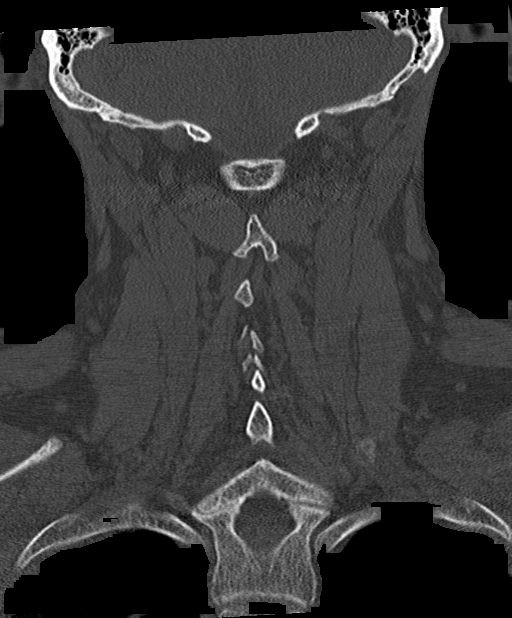

[Series 6: orthogonal bone · axial · 0.33mm/px · z∈[-314,-182]mm · 5 of 103 slices shown, 7 images]
[im 18/103  soft-tissue]
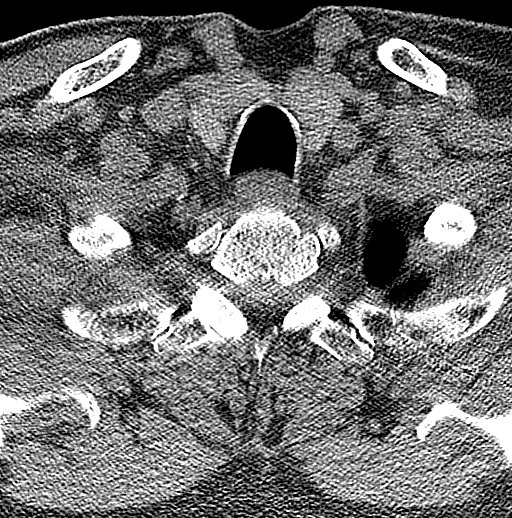
[im 18/103  bone]
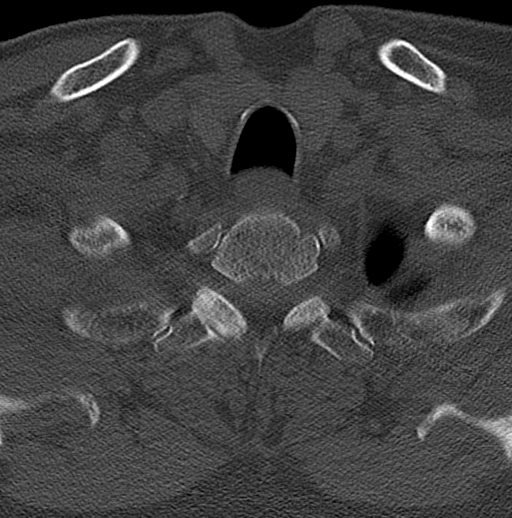
[im 35/103  bone]
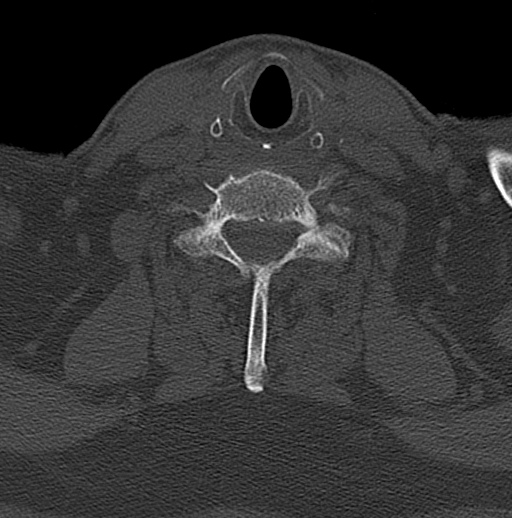
[im 52/103  bone]
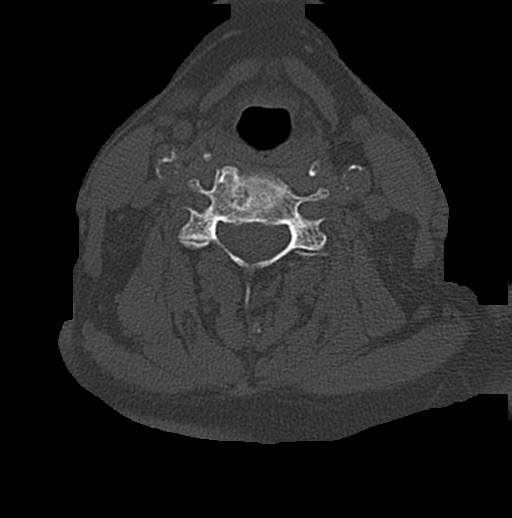
[im 69/103  bone]
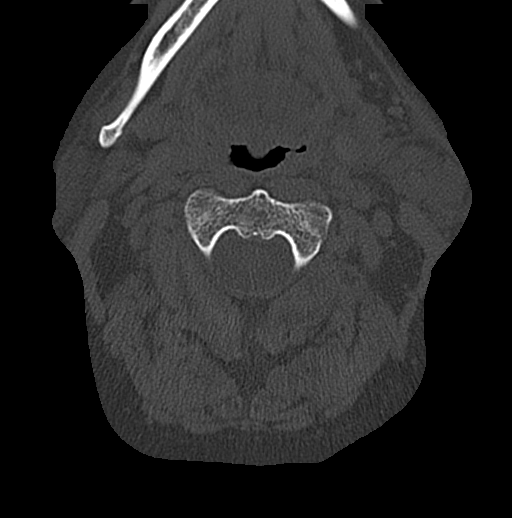
[im 86/103  soft-tissue]
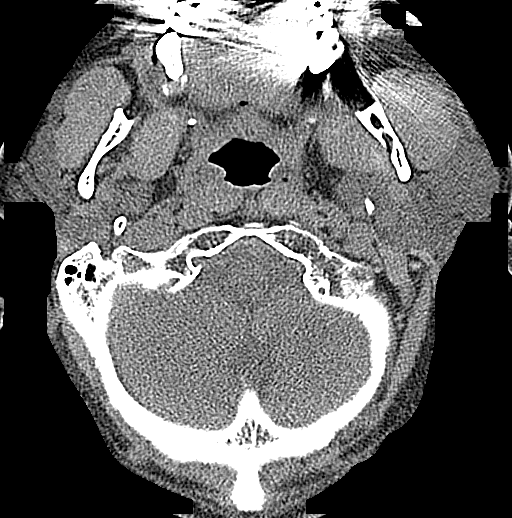
[im 86/103  bone]
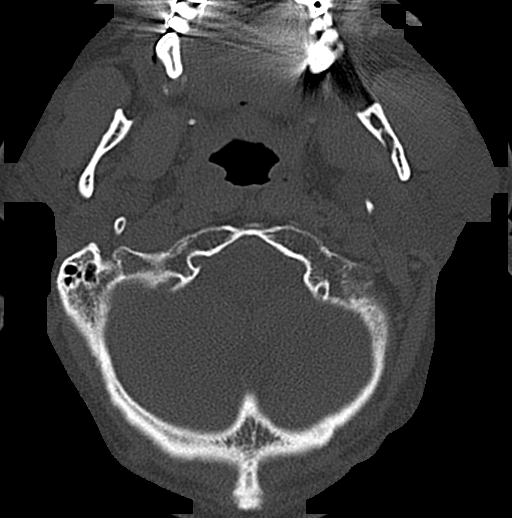

[15 of 33 positions shown; findings below may reference images not displayed]

FINDINGS: Alignment: No acute subluxation.

Skull base and vertebrae: No acute fracture. Osteopenia.

Soft tissues and spinal canal: No prevertebral fluid or swelling. No
visible canal hematoma.

Disc levels: Multilevel degenerative changes with endplate
irregularity and disc space narrowing.

Upper chest: Emphysema.

Other: Bilateral carotid bulb calcified plaques.
IMPRESSION: 1. No acute/traumatic cervical spine pathology.
2. Multilevel degenerative changes.
